# Patient Record
Sex: Female | Born: 1965 | Race: White | Hispanic: No | Marital: Single | Smoking: Never smoker
Health system: Southern US, Community
[De-identification: ages and names within clinical notes are randomized; demographics above are authoritative.]

## PROBLEM LIST (undated history)

## (undated) DIAGNOSIS — F419 Anxiety disorder, unspecified: Secondary | ICD-10-CM

## (undated) DIAGNOSIS — Z8619 Personal history of other infectious and parasitic diseases: Secondary | ICD-10-CM

## (undated) DIAGNOSIS — E079 Disorder of thyroid, unspecified: Secondary | ICD-10-CM

## (undated) DIAGNOSIS — R030 Elevated blood-pressure reading, without diagnosis of hypertension: Secondary | ICD-10-CM

## (undated) HISTORY — PX: TONSILLECTOMY AND ADENOIDECTOMY: SUR1326

## (undated) HISTORY — DX: Elevated blood-pressure reading, without diagnosis of hypertension: R03.0

## (undated) HISTORY — DX: Anxiety disorder, unspecified: F41.9

## (undated) HISTORY — DX: Personal history of other infectious and parasitic diseases: Z86.19

## (undated) HISTORY — DX: Disorder of thyroid, unspecified: E07.9

## (undated) HISTORY — PX: TYMPANOSTOMY TUBE PLACEMENT: SHX32

---

## 2000-11-04 ENCOUNTER — Encounter: Payer: Self-pay | Admitting: Specialist

## 2000-11-04 ENCOUNTER — Ambulatory Visit (HOSPITAL_COMMUNITY): Admission: RE | Admit: 2000-11-04 | Discharge: 2000-11-04 | Payer: Self-pay | Admitting: Specialist

## 2006-01-09 ENCOUNTER — Other Ambulatory Visit: Admission: RE | Admit: 2006-01-09 | Discharge: 2006-01-09 | Payer: Self-pay | Admitting: Obstetrics and Gynecology

## 2012-02-06 ENCOUNTER — Encounter: Payer: Self-pay | Admitting: Internal Medicine

## 2012-02-06 ENCOUNTER — Ambulatory Visit (INDEPENDENT_AMBULATORY_CARE_PROVIDER_SITE_OTHER): Payer: BC Managed Care – PPO | Admitting: Internal Medicine

## 2012-02-06 VITALS — BP 148/80 | HR 72 | Ht 63.5 in | Wt 190.0 lb

## 2012-02-06 DIAGNOSIS — F419 Anxiety disorder, unspecified: Secondary | ICD-10-CM

## 2012-02-06 DIAGNOSIS — R002 Palpitations: Secondary | ICD-10-CM

## 2012-02-06 DIAGNOSIS — Z8249 Family history of ischemic heart disease and other diseases of the circulatory system: Secondary | ICD-10-CM

## 2012-02-06 DIAGNOSIS — F411 Generalized anxiety disorder: Secondary | ICD-10-CM

## 2012-02-06 DIAGNOSIS — E669 Obesity, unspecified: Secondary | ICD-10-CM

## 2012-02-06 DIAGNOSIS — R239 Unspecified skin changes: Secondary | ICD-10-CM

## 2012-02-06 DIAGNOSIS — Z23 Encounter for immunization: Secondary | ICD-10-CM

## 2012-02-06 DIAGNOSIS — E063 Autoimmune thyroiditis: Secondary | ICD-10-CM

## 2012-02-06 DIAGNOSIS — R238 Other skin changes: Secondary | ICD-10-CM

## 2012-02-06 DIAGNOSIS — R030 Elevated blood-pressure reading, without diagnosis of hypertension: Secondary | ICD-10-CM

## 2012-02-06 DIAGNOSIS — R6889 Other general symptoms and signs: Secondary | ICD-10-CM

## 2012-02-06 LAB — CBC WITH DIFFERENTIAL/PLATELET
Basophils Absolute: 0.1 10*3/uL (ref 0.0–0.1)
Basophils Relative: 0.7 % (ref 0.0–3.0)
Eosinophils Absolute: 0.1 10*3/uL (ref 0.0–0.7)
Eosinophils Relative: 1.4 % (ref 0.0–5.0)
HCT: 44.6 % (ref 36.0–46.0)
Hemoglobin: 15.3 g/dL — ABNORMAL HIGH (ref 12.0–15.0)
Lymphocytes Relative: 26.9 % (ref 12.0–46.0)
Lymphs Abs: 2.2 10*3/uL (ref 0.7–4.0)
MCHC: 34.4 g/dL (ref 30.0–36.0)
MCV: 94.3 fl (ref 78.0–100.0)
Monocytes Absolute: 0.7 10*3/uL (ref 0.1–1.0)
Monocytes Relative: 8 % (ref 3.0–12.0)
Neutro Abs: 5.3 10*3/uL (ref 1.4–7.7)
Neutrophils Relative %: 63 % (ref 43.0–77.0)
Platelets: 293 10*3/uL (ref 150.0–400.0)
RBC: 4.73 Mil/uL (ref 3.87–5.11)
RDW: 13.3 % (ref 11.5–14.6)
WBC: 8.3 10*3/uL (ref 4.5–10.5)

## 2012-02-06 LAB — BASIC METABOLIC PANEL
BUN: 10 mg/dL (ref 6–23)
CO2: 27 mEq/L (ref 19–32)
Calcium: 9.2 mg/dL (ref 8.4–10.5)
Chloride: 106 mEq/L (ref 96–112)
Creatinine, Ser: 0.8 mg/dL (ref 0.4–1.2)
GFR: 85.25 mL/min (ref >60.00)
Glucose, Bld: 89 mg/dL (ref 70–99)
Potassium: 4.2 mEq/L (ref 3.5–5.1)
Sodium: 139 mEq/L (ref 135–145)

## 2012-02-06 LAB — FERRITIN: Ferritin: 28.9 ng/mL (ref 10.0–291.0)

## 2012-02-06 LAB — LIPID PANEL
Total CHOL/HDL Ratio: 3
VLDL: 15.8 mg/dL (ref 0.0–40.0)

## 2012-02-06 LAB — HEPATIC FUNCTION PANEL
AST: 19 U/L (ref 0–37)
Alkaline Phosphatase: 49 U/L (ref 39–117)
Total Bilirubin: 0.6 mg/dL (ref 0.3–1.2)

## 2012-02-06 LAB — POCT URINALYSIS DIPSTICK
Nitrite, UA: NEGATIVE
Urobilinogen, UA: 0.2
pH, UA: 8.5

## 2012-02-06 LAB — T4, FREE: Free T4: 0.66 ng/dL (ref 0.60–1.60)

## 2012-02-06 LAB — VITAMIN B12: Vitamin B-12: 546 pg/mL (ref 211–911)

## 2012-02-06 LAB — T3, FREE: T3, Free: 3.3 pg/mL (ref 2.3–4.2)

## 2012-02-06 NOTE — Patient Instructions (Addendum)
Will notify you  of labs when available. Stop the heating pad for now. Will check skin at next visit  Plan  Check up and pp and pelvic in about  A month.  Some of your sx could be from thyroid dysfucntion. Stop  Supplements as we dicussed and get nutrients in food but need  Reg MV wth vit d .

## 2012-02-06 NOTE — Progress Notes (Signed)
Subjective:    Patient ID: Christina Mccormick, female    DOB: May 11, 1966, 46 y.o.   MRN: [id number]  HPI Patient comes in as new patient visit . Previous care was remote   No PCP in the past numbner o years and is over due for  Last pap ?4-5 years ago.  Anxiety since  Childhood.  Prn    Anxiety  Usually premenstrual  Use ocass panic and obsessing. Dr Evelene Croon has given her prn xanax  Which does help. Only using about 5 days er month.  No sig etoh with hx of etoh issues in family. Functions pretty well but anxiety with doctor  visits Last Paps  About one decade.  No recent immunization.  THyroid: dx as hashimotos as a teen and at some point was on meds  Unsure why stopped . has been issue off and on.Considering going back on meds .   Able to exercise  [place] dance.  Losing weight 15 pounds  On [employer]   Exercising  3-4 x per week.  Has cold intolerance  Used heating pad on back when sits to warm up  In the evening  Palpitations began  About 6-9 months ago and  Usually related to period .  Last minutes and not assoc with syncope sob or extreme fatigue some worse with caffiene.  ocass etoh.  Caffiene.  Every day usually in am.   Some diet soda ocass.   Vegetarian  With fish .  Trying decrease sugar ; says she is a "Sugar fanatic   :.  ELEVated BP readings  In past but ? Felt to be from anxiety   Unsure if taking readings  Cause elevation Review of Systems  ROS:  GEN/ HEENT: No fever, significant weight changes sweats headaches vision problems hearing changes, CV/ PULM; No chest pain shortness of breath cough, syncope,edema  change in exercise tolerance. GI /GU: No adominal pain, vomiting, change in bowel habits. No blood in the stool. No significant GU symptoms. Periods ok  No changes some spotting  At times halting bleeding SKIN/HEME: ,no acute skin rashes suspicious lesions or bleeding. No lymphadenopathy, nodules, masses.  NEURO/ PSYCH:  No neurologic signs such as weakness numbness. No  depression IMM/ Allergy: No unusual infections.  Allergy .   REST of 12 system review negative except as per HPI  Both parents have ht and lipids   Past Medical History  Diagnosis Date  . Anxiety   . Elevated blood pressure reading   . Thyroid disease     unsure if sustained never treated  . Hx of varicella     History   Social History  . Marital Status: Single    Spouse Name: N/A    Number of Children: N/A  . Years of Education: N/A   Occupational History  . Not on file.   Social History Main Topics  . Smoking status: Never Smoker   . Smokeless tobacco: Not on file  . Alcohol Use: Yes     2-3 a month  . Drug Use: No  . Sexually Active: Not on file   Other Topics Concern  . 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  Has smoke detector and wears seat belts.  No firearms. No excess sun exposure. . No depression. Partial dentures uppercaffine in am  etoh 2-3  monthVegetarian diet recently     Past Surgical History  Procedure Date  . Tonsillectomy and adenoidectomy   . Tympanostomy tube placement     Family History  Problem Relation Age of Onset  . Hyperlipidemia Mother   . Hypertension Mother   . Hyperlipidemia Father   . Hypertension Father   . Celiac disease Father   . Alcohol abuse Maternal Grandmother   . Heart disease Maternal Grandfather   . Alcohol abuse Paternal Grandmother   . Alcohol abuse Paternal Grandfather   . Melanoma      uncle     Allergies  Allergen Reactions  . Sulfa Antibiotics Rash    No current outpatient prescriptions on file prior to visit.    BP 148/80  Pulse 72  Ht 5' 3.5" (1.613 m)  Wt 190 lb (86.183 kg)  BMI 33.13 kg/m2  LMP 01/19/2012       Objective:   Physical Exam Physical Exam: Vital signs reviewed [id number] is a well-developed well-nourished alert cooperative  white  female who appears her stated age in no acute distress. Talks fast anxious but good eye contact  .  HEENT: normocephalic atraumatic , Eyes: PERRL EOM's full, conjunctiva clear, Nares: paten,t no deformity discharge or tenderness., Ears: no deformity EAC's clear TMs with normal landmarks. Mouth: clear OP, no lesions, edema.  Moist mucous membranes. Dentition in adequate repair. Upper partials  NECK: supple without masses,  or bruits. Smooth goiter 2-3 x non tender and no nodules felt CHEST/PULM:  Clear to auscultation and percussion breath sounds equal no wheeze , rales or rhonchi. No chest wall deformities or tenderness. CV: PMI is nondisplaced, S1 S2 no gallops, murmurs, rubs. Peripheral pulses are full without delay.No JVD .  ABDOMEN: Bowel sounds normal nontender  No guard or rebound, no hepato splenomegal no CVA tenderness.  Extremtities:  No clubbing cyanosis or edema, no acute joint swelling or redness no focal atrophy NEURO:  Oriented x3, cranial nerves 3-12 appear to be intact, no obvious focal weakness,gait within normal limits no abnormal reflexes or asymmetrical SKIN: No acute rashes normal turgor, color, no bruising or petechiae.EXCEPT mottling  On lower lumbar area with irreg areas of re pink  Linear  Non tender and not itching  PSYCH: Oriented, good eye contact, no obvious depression , cognition and judgment appear normal. LN: no cervical adenopathy    EKG  NSR nl intervals     Assessment & Plan:  Preventive Health Care Delayed    Need to review and get UTD   But address other issues today dsic supplement use and dec pill count unless for a reason such as ca vit d keep exercising and lose weight Flu shot.  Advise d come back for pap pelvic  Get mammo  Anxiety apparently functions well with this  On prn meds   Aware of risk benefit  Would continue to have Dr Kirtland Bouchard rx meds  Thyroid disease hx  Has goiter follow labs for now BP readings  Elevated  Has fam hx but also anxiety   Disc plus  minus of home monitoring  when plans on getting machine to try and bring in to next visit with readings Cold  Intolerance per pt     Plan labs and fu Heat  Affected skin area  ? Fat necrosis and healing burn?   Will follow  Stop the heating pad  Family hx  Palpitations  Nl exam  Many causes possible  Nl   ekg  Get lab  Minimize caffiene and fu. Sx better with exercise to continue.     Prolonged visit today 50 minutes

## 2012-02-07 ENCOUNTER — Encounter: Payer: Self-pay | Admitting: Internal Medicine

## 2012-02-07 DIAGNOSIS — E669 Obesity, unspecified: Secondary | ICD-10-CM | POA: Insufficient documentation

## 2012-02-07 DIAGNOSIS — F419 Anxiety disorder, unspecified: Secondary | ICD-10-CM | POA: Insufficient documentation

## 2012-02-07 DIAGNOSIS — R239 Unspecified skin changes: Secondary | ICD-10-CM | POA: Insufficient documentation

## 2012-02-07 DIAGNOSIS — E063 Autoimmune thyroiditis: Secondary | ICD-10-CM | POA: Insufficient documentation

## 2012-02-07 DIAGNOSIS — R002 Palpitations: Secondary | ICD-10-CM | POA: Insufficient documentation

## 2012-02-07 DIAGNOSIS — R6889 Other general symptoms and signs: Secondary | ICD-10-CM | POA: Insufficient documentation

## 2012-02-12 ENCOUNTER — Other Ambulatory Visit: Payer: Self-pay | Admitting: Internal Medicine

## 2012-02-12 MED ORDER — LEVOTHYROXINE SODIUM 50 MCG PO TABS: 50.0000 ug | ORAL_TABLET | Freq: Every day | ORAL | Status: DC

## 2012-02-12 NOTE — Progress Notes (Signed)
Quick Note:  Pt aware of results. ______ 

## 2012-03-05 ENCOUNTER — Encounter: Payer: Self-pay | Admitting: Internal Medicine

## 2012-03-05 ENCOUNTER — Ambulatory Visit (INDEPENDENT_AMBULATORY_CARE_PROVIDER_SITE_OTHER): Payer: BC Managed Care – PPO | Admitting: Internal Medicine

## 2012-03-05 ENCOUNTER — Other Ambulatory Visit (HOSPITAL_COMMUNITY)
Admission: RE | Admit: 2012-03-05 | Discharge: 2012-03-05 | Disposition: A | Discharge status: Home / Self Care | Payer: BC Managed Care – PPO | Source: Ambulatory Visit | Attending: Internal Medicine | Admitting: Internal Medicine

## 2012-03-05 VITALS — BP 130/80 | HR 88 | Wt 190.0 lb

## 2012-03-05 DIAGNOSIS — F411 Generalized anxiety disorder: Secondary | ICD-10-CM

## 2012-03-05 DIAGNOSIS — R002 Palpitations: Secondary | ICD-10-CM

## 2012-03-05 DIAGNOSIS — Z0000 Encounter for general adult medical examination without abnormal findings: Secondary | ICD-10-CM

## 2012-03-05 DIAGNOSIS — E063 Autoimmune thyroiditis: Secondary | ICD-10-CM

## 2012-03-05 DIAGNOSIS — Z01419 Encounter for gynecological examination (general) (routine) without abnormal findings: Secondary | ICD-10-CM | POA: Insufficient documentation

## 2012-03-05 DIAGNOSIS — F419 Anxiety disorder, unspecified: Secondary | ICD-10-CM

## 2012-03-05 DIAGNOSIS — E669 Obesity, unspecified: Secondary | ICD-10-CM

## 2012-03-05 DIAGNOSIS — N852 Hypertrophy of uterus: Secondary | ICD-10-CM

## 2012-03-05 NOTE — Progress Notes (Signed)
Subjective:    Patient ID: Christina Mccormick, female    DOB: 1966/10/14, 46 y.o.   MRN: [id number]  HPI Patient comes in today for preventive visit and follow-up of medical issues. Update  history since  last visit: doing better . Some decrease in anxiety and possibly blood pressure. Review her symptoms and she has been on the Synthroid. His aware of the laboratory studies we did at last visit Not had a Pap and pelvic exam but twice in her life.  Not high risk neg sa   Review of Systems Neg cp sob  Skin better on back  Less could no bleeding  Periods halting but normally monthly  No excessive cramps. No uti sx.   Past history family history social history reviewed in the electronic medical record.     Objective:   Physical Exam  Wt Readings from Last 3 Encounters:  03/05/12 190 lb (86.183 kg)  02/06/12 190 lb (86.183 kg)    Physical Exam: Vital signs reviewed NWG:[id number] is a well-developed well-nourished alert cooperative  white female who appears her stated age in no acute distress. mildy anxious  HEENT: normocephalic atraumatic , Eyes: PERRL EOM's full, conjunctiva clear, Nares: paten,t no deformity discharge or tenderness., Ears: no deformity EAC's clear TMs with normal landmarks. Mouth: clear OP, no lesions, edema.  Moist mucous membranes. Dentition in adequate repair. NECK: supple without masses, or bruits.  thyroid CHEST/PULM:  Clear to auscultation and percussion breath sounds equal no wheeze , rales or rhonchi. No chest wall deformities or tenderness. CV: PMI is nondisplaced, S1 S2 no gallops, murmurs, rubs. Peripheral pulses are full without delay.No JVD .  ABDOMEN: Bowel sounds normal nontender  No guard or rebound, no hepato splenomegal no CVA tenderness.  No hernia. Extremtities:  No clubbing cyanosis or edema, no acute joint swelling or redness no focal atrophy NEURO:  Oriented x3, cranial nerves 3-12 appear to be intact, no obvious focal weakness,gait within normal limits no  abnormal reflexes or asymmetrical SKIN: No acute rashes normal turgor, color, no bruising or petechiae. PSYCH: Oriented, good eye contact, no obvious depression , cognition and judgment appear normal. Anxiety but better than last time. LN: no cervical axillary inguinal adenopathy   Breast: normal by inspection . No dimpling, discharge, masses, tenderness or discharge . Pelvic: NL ext GU, labia clear without lesions or rash . Vagina no lesions .Cervix: clear  UTERUS: Neg CMT   Enlarged on right   To almost iliac crest Adnexa:  ? Hard to tell on right cause of enlarge uterus left  no masses . PAP done  Rectal no masses stool heme neg   Lab Results  Component Value Date   WBC 8.3 02/06/2012   HGB 15.3* 02/06/2012   HCT 44.6 02/06/2012   PLT 293.0 02/06/2012   GLUCOSE 89 02/06/2012   CHOL 211* 02/06/2012   TRIG 79.0 02/06/2012   HDL 66.30 02/06/2012   LDLDIRECT 131.0 02/06/2012   ALT 13 02/06/2012   AST 19 02/06/2012   NA 139 02/06/2012   K 4.2 02/06/2012   CL 106 02/06/2012   CREATININE 0.8 02/06/2012   BUN 10 02/06/2012   CO2 27 02/06/2012   TSH 4.57 02/06/2012         Assessment & Plan:  Preventive Health Care Counseled regarding healthy nutrition, exercise, sleep, injury prevention, calcium vit d and healthy weight .  Hashimotos   On low dose repalcement and feels some beter already. No se of meds  Check tsh  in 2 months and ov.   Bp  Better   OBesity  anxiety  Enlarged uterus   Presumed  Fibroids.

## 2012-03-05 NOTE — Patient Instructions (Addendum)
Your exam is normal except that you have an enlarged uterus. This is probably from fibroids.  Would recommend ultrasound test of your uterus to confirm the diagnosis.  If this is the case then  no treatment intervention recommended unless she develops severe bleeding or other problems. We could get  a gynecologist to see you at that point.  Otherwise take the thyroid replacment and then  Check labs in 2 more months ( tsh)  Fibroids You have been diagnosed as having a fibroid. Fibroids are smooth muscle lumps (tumors) which can occur any place in a woman's body. They are usually in the womb (uterus). The most common problem (symptom) of fibroids is bleeding. Over time this may cause low red blood cells (anemia). Other symptoms include feelings of pressure and pain in the pelvis. The diagnosis (learning what is wrong) of fibroids is made by physical exam. Sometimes tests such as an ultrasound are used. This is helpful when fibroids are felt around the ovaries and to look for tumors. TREATMENT   Most fibroids do not need surgical or medical treatment. Sometimes a tissue sample (biopsy) of the lining of the uterus is done to rule out cancer. If there is no cancer and only a small amount of bleeding, the problem can be watched.   Hormonal treatment can improve the problem.   When surgery is needed, it can consist of removing the fibroid. Vaginal birth may not be possible after the removal of fibroids. This depends on where they are and the extent of surgery. When pregnancy occurs with fibroids it is usually normal.   Your caregiver can help decide which treatments are best for you.  HOME CARE INSTRUCTIONS   Do not use aspirin as this may increase bleeding problems.   If your periods (menses) are heavy, record the number of pads or tampons used per month. Bring this information to your caregiver. This can help them determine the best treatment for you.  SEEK IMMEDIATE MEDICAL CARE IF:  You have  pelvic pain or cramps not controlled with medications, or experience a sudden increase in pain.   You have an increase of pelvic bleeding between and during menses.   You feel lightheaded or have fainting spells.   You develop worsening belly (abdominal) pain.  Document Released: 12/12/2000 Document Revised: 12/04/2011 Document Reviewed: 08/03/2008 [facility] Patient Information 2012 [place], [place].    Transvaginal Ultrasound Transvaginal ultrasound is a pelvic ultrasound, using a metal probe that is placed in the vagina, to look at a women's female organs. Transvaginal ultrasound is a method of seeing inside the pelvis of a woman. The ultrasound machine sends out sound waves from the transducer (probe). These sound waves bounce off body structures (like an echo) to create a picture. The picture shows up on a monitor. It is called transvaginal because the probe is inserted into the vagina. There should be very little discomfort from the vaginal probe. This test can also be used during pregnancy. Endovaginal ultrasound is another name for a transvaginal ultrasound. In a transabdominal ultrasound, the probe is placed on the outside of the belly. This method gives pictures that are lower quality than pictures from the transvaginal technique. Transvaginal ultrasound is used to look for problems of the female genital tract. Some such problems include:  Infertility problems.   Congenital (birth defect) malformations of the uterus and ovaries.   Tumors in the uterus.   Abnormal bleeding.   Ovarian tumors and cysts.   Abscess (inflamed tissue around  pus) in the pelvis.   Unexplained abdominal or pelvic pain.   Pelvic infection.  DURING PREGNANCY, TRANSVAGINAL ULTRASOUND MAY BE USED TO LOOK AT:  Normal pregnancy.   Ectopic pregnancy (pregnancy outside the uterus).   Fetal heartbeat.   Abnormalities in the pelvis, that are not seen well with transabdominal ultrasound.   Suspected twins  or multiples.   Impending miscarriage.   Problems with the cervix (incompetent cervix, not able to stay closed and hold the baby).   When doing an amniocentesis (removing fluid from the pregnancy sac, for testing).   Looking for abnormalities of the baby.   Checking the growth, development, and age of the fetus.   Measuring the amount of fluid in the amniotic sac.   When doing an external version of the baby (moving baby into correct position).   Evaluating the baby for problems in high risk pregnancies (biophysical profile).   Suspected fetal demise (death).  Sometimes a special ultrasound method called Saline Infusion Sonography (SIS) is used for a more accurate look at the uterus. Sterile saline (salt water) is injected into the uterus of non-pregnant patients to see the inside of the uterus better. SIS is not used on pregnant women. The vaginal probe can also assist in obtaining biopsies of abnormal areas, in draining fluid from cysts on the ovary, and in finding IUDs (intrauterine device, birth control) that cannot be located. PREPARATION FOR TEST A transvaginal ultrasound is done with the bladder empty. The transabdominal ultrasound is done with your bladder full. You may be asked to drink several glasses of water before that exam. Sometimes, a transabdominal ultrasound is done just after a transvaginal ultrasound, to look at organs in your abdomen. PROCEDURE  You will lie down on a table, with your knees bent and your feet in foot holders. The probe is covered with a condom. A sterile lubricant is put into the vagina and on the probe. The lubricant helps transmit the sound waves and avoid irritating the vagina. Your caregiver will move the probe inside the vaginal cavity to scan the pelvic structures. A normal test will show a normal pelvis and normal contents. An abnormal test will show abnormalities of the pelvis, placenta, or baby. ABNORMAL RESULTS MAY BE DUE TO:  Growths or  tumors in the:   Uterus.   Ovaries.   Vagina.   Other pelvic structures.   Non-cancerous growths of the uterus and ovaries.   Twisting of the ovary, cutting off blood supply to the ovary (ovarian torsion).   Areas of infection, including:   Pelvic inflammatory disease.   Abscess in the pelvis.   Locating an IUD.  PROBLEMS FOUND IN PREGNANT WOMEN MAY INCLUDE:  Ectopic pregnancy (pregnancy outside the uterus).   Multiple pregnancies.   Early dilation (opening) of the cervix. This may indicate an incompetent cervix and early delivery.   Impending miscarriage.   Fetal death.   Problems with the placenta, including:   Placenta has grown over the opening of the womb (placenta previa).   Placenta has separated early in the womb (placental abruption).   Placenta grows into the muscle of the uterus (placenta accreta).   Tumors of pregnancy, including gestational trophoblastic disease. This is an abnormal pregnancy, with no fetus. The uterus is filled with many grape-like cysts that could sometimes be cancerous.   Incorrect position of the fetus (breech, vertex).   Intrauterine fetal growth retardation (IUGR) (poor growth in the womb).   Fetal abnormalities or infection.  RISKS  AND COMPLICATIONS There are no known risks to the ultrasound procedure. There is no X-ray used when doing an ultrasound. Document Released: 11/26/2004 Document Revised: 12/04/2011 Document Reviewed: 11/14/2009 [facility] Patient Information 2012 [place], [place].

## 2012-03-08 ENCOUNTER — Other Ambulatory Visit: Payer: Self-pay | Admitting: Internal Medicine

## 2012-03-08 DIAGNOSIS — N852 Hypertrophy of uterus: Secondary | ICD-10-CM

## 2012-03-10 NOTE — Progress Notes (Signed)
Quick Note:  Tell patient PAP is normal. ______ 

## 2012-03-11 ENCOUNTER — Encounter: Payer: Self-pay | Admitting: *Deleted

## 2012-03-11 NOTE — Progress Notes (Signed)
Quick Note:    Letter sent to pt.  ______

## 2012-04-05 ENCOUNTER — Other Ambulatory Visit: Payer: Self-pay | Admitting: Internal Medicine

## 2012-04-23 ENCOUNTER — Ambulatory Visit
Admission: RE | Admit: 2012-04-23 | Discharge: 2012-04-23 | Disposition: A | Discharge status: Home / Self Care | Payer: BC Managed Care – PPO | Source: Ambulatory Visit | Attending: Internal Medicine | Admitting: Internal Medicine

## 2012-04-23 DIAGNOSIS — N852 Hypertrophy of uterus: Secondary | ICD-10-CM

## 2012-04-27 NOTE — Progress Notes (Signed)
Pt informed- instructed to call back if starts having painful m enstration

## 2012-05-07 ENCOUNTER — Ambulatory Visit (INDEPENDENT_AMBULATORY_CARE_PROVIDER_SITE_OTHER): Payer: BC Managed Care – PPO | Admitting: Internal Medicine

## 2012-05-07 ENCOUNTER — Encounter: Payer: Self-pay | Admitting: Internal Medicine

## 2012-05-07 VITALS — BP 122/90 | HR 105 | Temp 98.4°F | Wt 191.0 lb

## 2012-05-07 DIAGNOSIS — D259 Leiomyoma of uterus, unspecified: Secondary | ICD-10-CM | POA: Insufficient documentation

## 2012-05-07 DIAGNOSIS — E063 Autoimmune thyroiditis: Secondary | ICD-10-CM

## 2012-05-07 LAB — TSH: TSH: 2.67 u[IU]/mL (ref 0.35–5.50)

## 2012-05-07 NOTE — Progress Notes (Signed)
  Subjective:    Patient ID: JOEE IOVINE, female    DOB: [birth year], 46 y.o.   MRN: [id number]  HPI Pt comes in  For labs and fu [place] . Periods ok but some pain . Had [place]  Feels much better less anxiety  On thyroid replacement. Less cold on meds.  Anxiety is better now   bp seems ok  hasnt checked   Review of Systems No new sx.  No cp sob vision change bleeding  Past history family history social history reviewed in the electronic medical record.    Objective:   Physical Exam BP 122/90  Pulse 105  Temp(Src) 98.4 F (36.9 C) (Oral)  Wt 191 lb (86.637 kg)  SpO2 98%  LMP 05/06/2012 wdwn in nad Repeat bp right 144/88   See  [place] multiple fibroids no other abnormalities reviewed with pt   Lab Results  Component Value Date   TSH 4.57 02/06/2012      Assessment & Plan:  Thyroid itis   Disc about this Reviewed   Process   CPX in a year but can adjust meds over the phone with labs etc.  Fibroids.    Enlarged uterus  nosx at this point   so will  Just abserve  bp  Check at home    Continue lifestyle intervention healthy eating and exercise .   Lab pending  cpx in a year of earlier if needed   Cost concerns with high deductible.

## 2012-05-07 NOTE — Patient Instructions (Signed)
Get the tsh today and then will notify you  Get bp readings at convenience .  Otherwise can do yearly check .

## 2012-05-12 ENCOUNTER — Other Ambulatory Visit: Payer: Self-pay | Admitting: Internal Medicine

## 2012-05-12 DIAGNOSIS — Z8639 Personal history of other endocrine, nutritional and metabolic disease: Secondary | ICD-10-CM

## 2012-05-12 NOTE — Progress Notes (Signed)
Quick Note:  Spoke with pt- informed of lab resutls and dr. Rosezella [place] instructions - repeat lab in 6 mos - future order done ______

## 2012-06-02 ENCOUNTER — Other Ambulatory Visit: Payer: Self-pay | Admitting: Internal Medicine

## 2012-08-21 IMAGING — US US PELVIS COMPLETE
1 series · 13 of 25 positions shown · non-contrast
Comparison: None.

CLINICAL DATA: Possible uterine fibroid felt on physical
examination.  Severe abdominal and pelvic pain during menstrual
periods.

TRANSABDOMINAL AND TRANSVAGINAL ULTRASOUND OF PELVIS
TECHNIQUE: Both transabdominal and transvaginal ultrasound
examinations of the pelvis were performed. Transabdominal technique
was performed for global imaging of the pelvis including uterus,
ovaries, adnexal regions, and pelvic cul-de-sac.

[Series 1: us pelvis complete · 0.35mm/px · 13 of 69 slices shown]
[im 1/69]
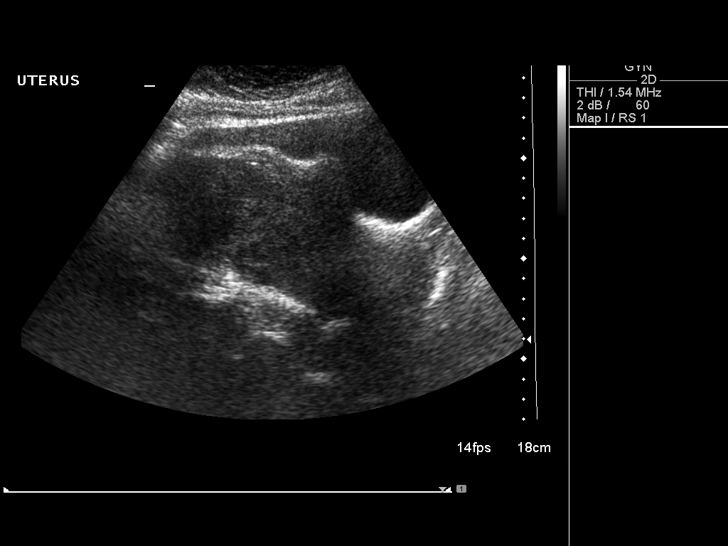
[im 6/69]
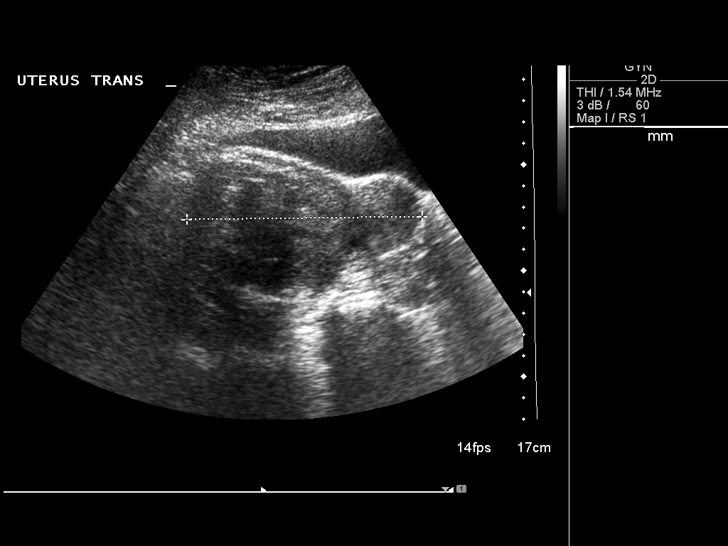
[im 12/69]
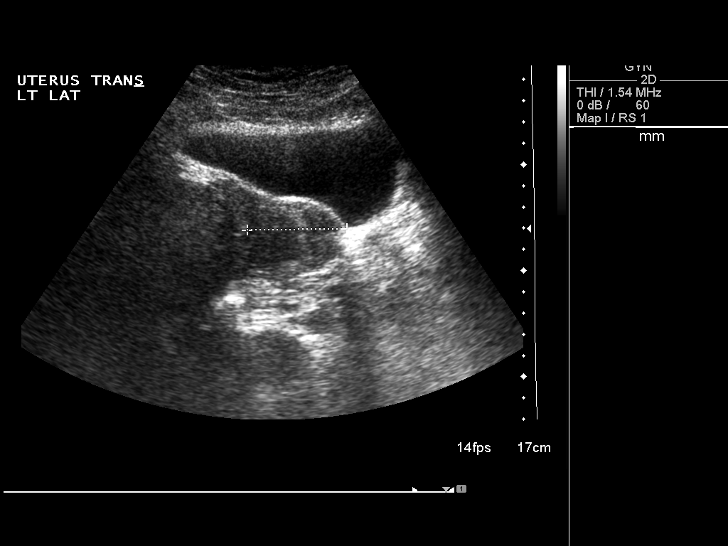
[im 18/69]
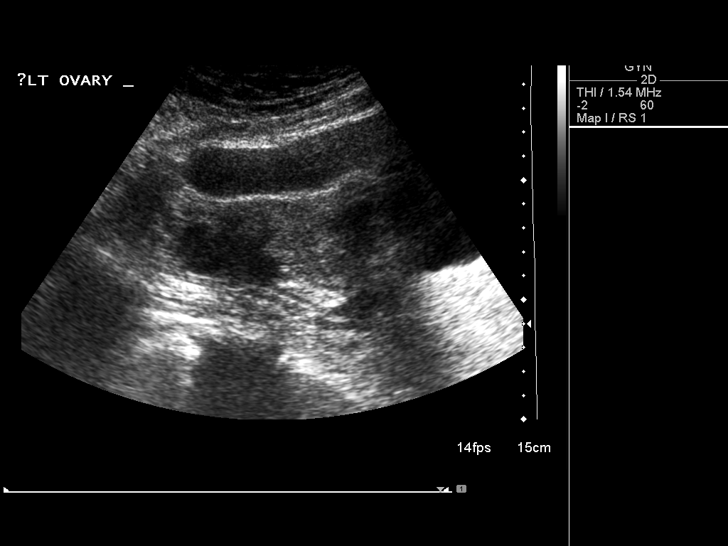
[im 23/69]
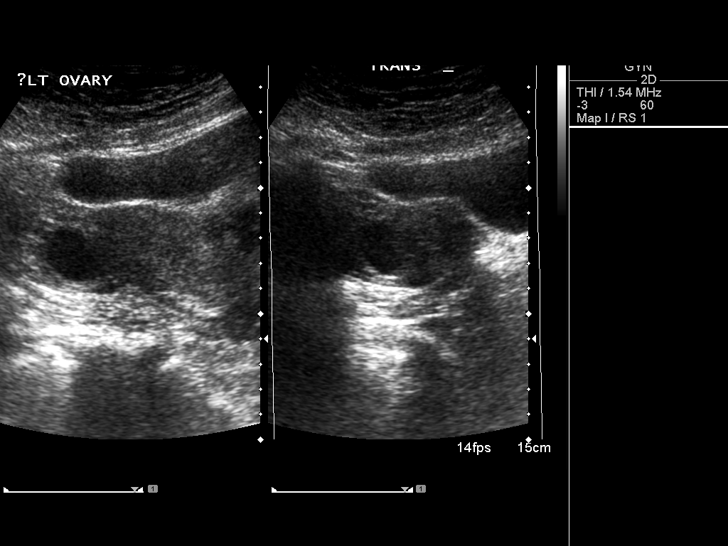
[im 29/69]
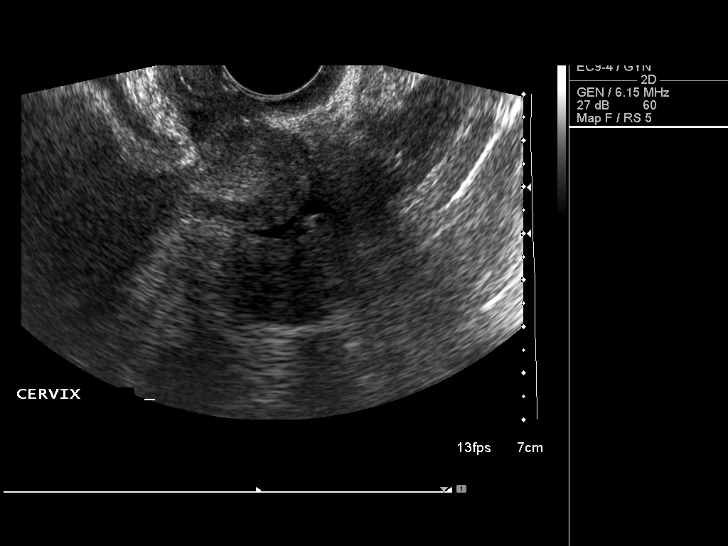
[im 35/69]
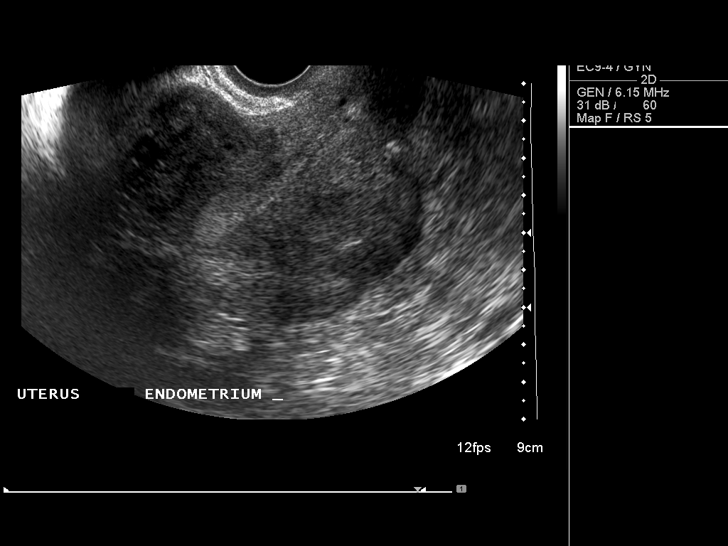
[im 40/69]
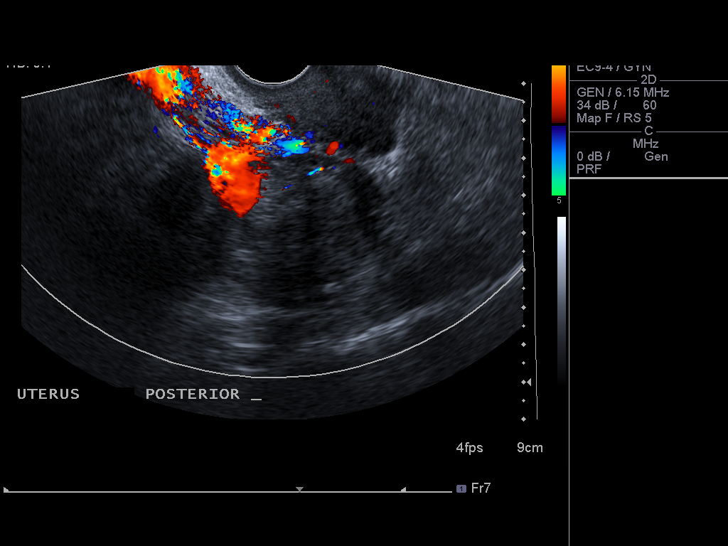
[im 46/69]
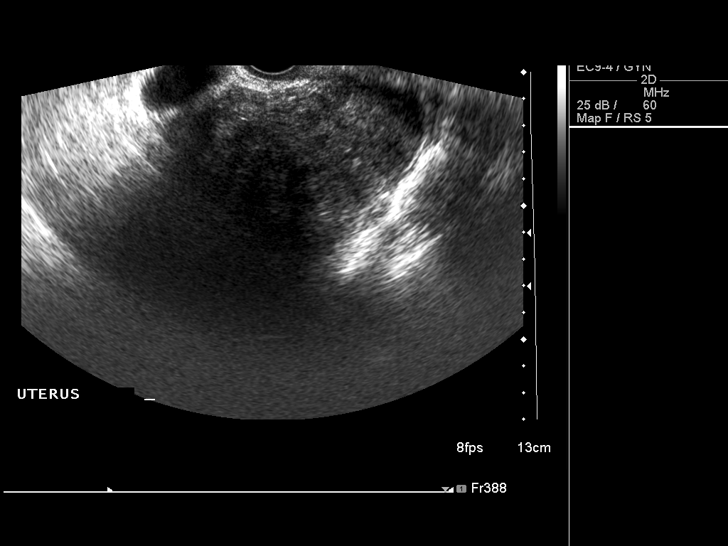
[im 52/69]
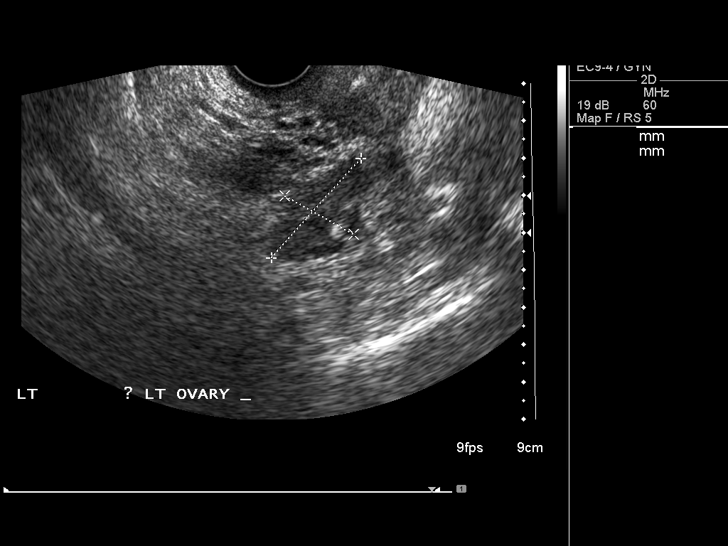
[im 57/69]
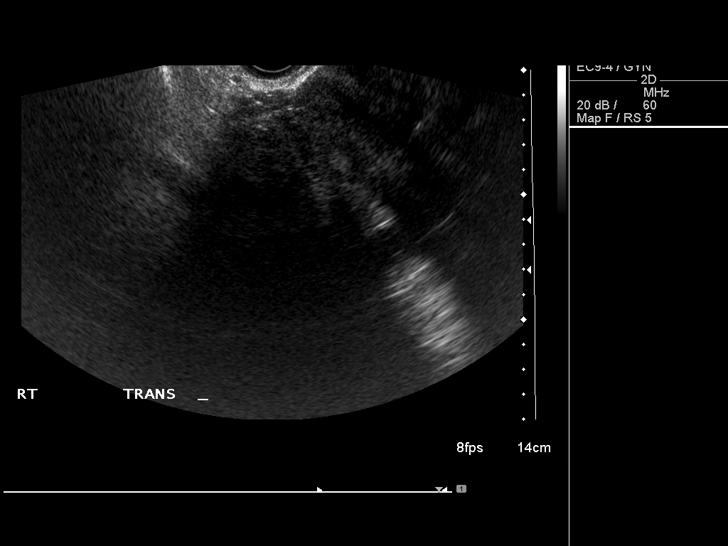
[im 63/69]
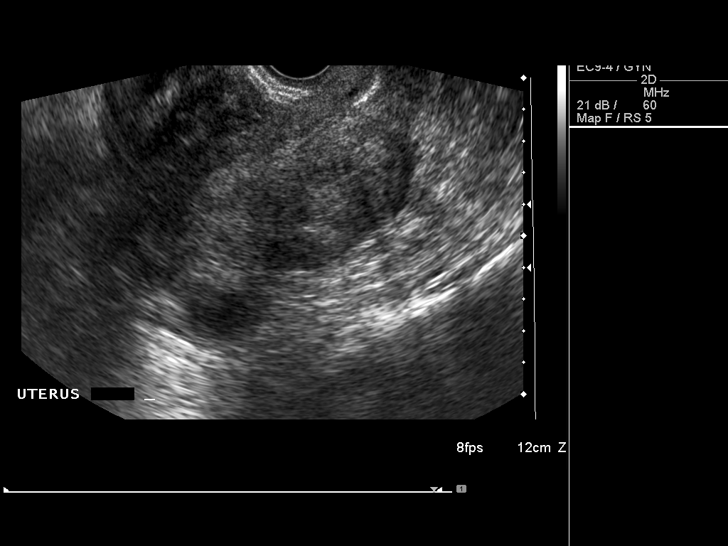
[im 69/69]
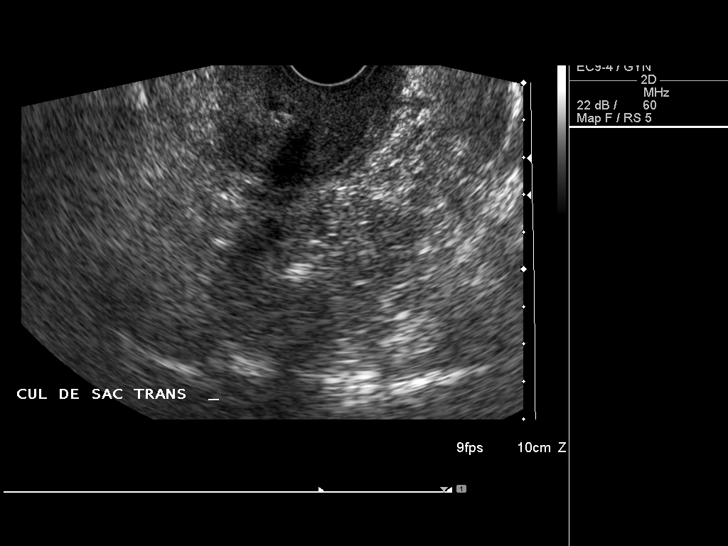

[13 of 25 positions shown; findings below may reference images not displayed]

It was necessary to proceed with endovaginal exam following the
transabdominal exam to visualize the uterus and ovaries in better
detail..
FINDINGS: Uterus: Normally positioned.  Large mass extending from the uterine
fundus into the lower quadrant of the abdomen on the right.  The
margins of the mass are difficult to visualize, making measurement
difficult.  There is a second mass arising exophytically from the
anterior uterine fundus, measuring 3.9 x 3.5 x 3.3 cm.  A third
mass is noted posteriorly on the right, measuring 4.8 x 4.1 x
cm.  None of these masses appeared to have submucosal components.
The uterus measures approximately 14.6 x 11.1 x 6.9 cm in maximum
dimensions.

Endometrium: Normal in thickness and appearance

Right ovary:  Normal, containing a 2.38 cm follicle.

Left ovary: Normal.

Other findings: No free fluid
IMPRESSION: Multiple uterine fibroids, as described above.

## 2012-11-08 ENCOUNTER — Other Ambulatory Visit: Payer: Self-pay | Admitting: Internal Medicine

## 2012-11-09 ENCOUNTER — Other Ambulatory Visit: Payer: Self-pay | Admitting: Internal Medicine

## 2013-02-11 ENCOUNTER — Encounter: Payer: BC Managed Care – PPO | Admitting: Internal Medicine

## 2013-03-19 ENCOUNTER — Other Ambulatory Visit: Payer: Self-pay | Admitting: Internal Medicine

## 2013-06-17 ENCOUNTER — Ambulatory Visit (INDEPENDENT_AMBULATORY_CARE_PROVIDER_SITE_OTHER): Payer: No Typology Code available for payment source | Admitting: Internal Medicine

## 2013-06-17 ENCOUNTER — Encounter: Payer: Self-pay | Admitting: Internal Medicine

## 2013-06-17 VITALS — BP 130/80 | HR 115 | Temp 98.5°F | Ht 64.0 in | Wt 194.0 lb

## 2013-06-17 DIAGNOSIS — Z23 Encounter for immunization: Secondary | ICD-10-CM

## 2013-06-17 DIAGNOSIS — Z0000 Encounter for general adult medical examination without abnormal findings: Secondary | ICD-10-CM

## 2013-06-17 DIAGNOSIS — E063 Autoimmune thyroiditis: Secondary | ICD-10-CM

## 2013-06-17 LAB — HEPATIC FUNCTION PANEL
Alkaline Phosphatase: 59 U/L (ref 39–117)
Bilirubin, Direct: 0.1 mg/dL (ref 0.0–0.3)
Total Protein: 7.8 g/dL (ref 6.0–8.3)

## 2013-06-17 LAB — BASIC METABOLIC PANEL
Calcium: 9.4 mg/dL (ref 8.4–10.5)
GFR: 85.99 mL/min (ref >60.00)
Glucose, Bld: 106 mg/dL — ABNORMAL HIGH (ref 70–99)
Sodium: 138 mEq/L (ref 135–145)

## 2013-06-17 LAB — LIPID PANEL
HDL: 60.3 mg/dL (ref >39.00)
VLDL: 20.6 mg/dL (ref 0.0–40.0)

## 2013-06-17 LAB — CBC WITH DIFFERENTIAL/PLATELET
Basophils Absolute: 0.1 10*3/uL (ref 0.0–0.1)
Hemoglobin: 14.9 g/dL (ref 12.0–15.0)
Lymphocytes Relative: 22.1 % (ref 12.0–46.0)
Monocytes Relative: 7.3 % (ref 3.0–12.0)
Neutro Abs: 5.7 10*3/uL (ref 1.4–7.7)
RDW: 12.8 % (ref 11.5–14.6)
WBC: 8.3 10*3/uL (ref 4.5–10.5)

## 2013-06-17 LAB — TSH: TSH: 5.35 u[IU]/mL (ref 0.35–5.50)

## 2013-06-17 LAB — LDL CHOLESTEROL, DIRECT: Direct LDL: 154.4 mg/dL

## 2013-06-17 LAB — T4, FREE: Free T4: 0.75 ng/dL (ref 0.60–1.60)

## 2013-06-17 NOTE — Patient Instructions (Addendum)
Continue lifestyle intervention healthy eating and exercise .  Hep a today   Second in 6 months  Check on [email or web address]  For travel advice. Will notify you  of labs when available.  Preventive in a year  Preventive Care for Adults, Female A healthy lifestyle and preventive care can promote health and wellness. Preventive health guidelines for women include the following key practices.  A routine yearly physical is a good way to check with your caregiver about your health and preventive screening. It is a chance to share any concerns and updates on your health, and to receive a thorough exam.  Visit your dentist for a routine exam and preventive care every 6 months. Brush your teeth twice a day and floss once a day. Good oral hygiene prevents tooth decay and gum disease.  The frequency of eye exams is based on your age, health, family medical history, use of contact lenses, and other factors. Follow your caregiver's recommendations for frequency of eye exams.  Eat a healthy diet. Foods like vegetables, fruits, whole grains, low-fat dairy products, and lean protein foods contain the nutrients you need without too many calories. Decrease your intake of foods high in solid fats, added sugars, and salt. Eat the right amount of calories for you.Get information about a proper diet from your caregiver, if necessary.  Regular physical exercise is one of the most important things you can do for your health. Most adults should get at least 150 minutes of moderate-intensity exercise (any activity that increases your heart rate and causes you to sweat) each week. In addition, most adults need muscle-strengthening exercises on 2 or more days a week.  Maintain a healthy weight. The body mass index (BMI) is a screening tool to identify possible weight problems. It provides an estimate of body fat based on height and weight. Your caregiver can help determine your BMI, and can help you achieve or maintain a healthy  weight.For adults 20 years and older:  A BMI below 18.5 is considered underweight.  A BMI of 18.5 to 24.9 is normal.  A BMI of 25 to 29.9 is considered overweight.  A BMI of 30 and above is considered obese.  Maintain normal blood lipids and cholesterol levels by exercising and minimizing your intake of saturated fat. Eat a balanced diet with plenty of fruit and vegetables. Blood tests for lipids and cholesterol should begin at age 32 and be repeated every 5 years. If your lipid or cholesterol levels are high, you are over 50, or you are at high risk for heart disease, you may need your cholesterol levels checked more frequently.Ongoing high lipid and cholesterol levels should be treated with medicines if diet and exercise are not effective.  If you smoke, find out from your caregiver how to quit. If you do not use tobacco, do not start.  If you are pregnant, do not drink alcohol. If you are breastfeeding, be very cautious about drinking alcohol. If you are not pregnant and choose to drink alcohol, do not exceed 1 drink per day. One drink is considered to be 12 ounces (355 mL) of beer, 5 ounces (148 mL) of wine, or 1.5 ounces (44 mL) of liquor.  Avoid use of street drugs. Do not share needles with anyone. Ask for help if you need support or instructions about stopping the use of drugs.  High blood pressure causes heart disease and increases the risk of stroke. Your blood pressure should be checked at least every 1 to 2  years. Ongoing high blood pressure should be treated with medicines if weight loss and exercise are not effective.  If you are 8 to 45 years old, ask your caregiver if you should take aspirin to prevent strokes.  Diabetes screening involves taking a blood sample to check your fasting blood sugar level. This should be done once every 3 years, after age 34, if you are within normal weight and without risk factors for diabetes. Testing should be considered at a younger age or be  carried out more frequently if you are overweight and have at least 1 risk factor for diabetes.  Breast cancer screening is essential preventive care for women. You should practice "breast self-awareness." This means understanding the normal appearance and feel of your breasts and may include breast self-examination. Any changes detected, no matter how small, should be reported to a caregiver. Women in their 60s and 30s should have a clinical breast exam (CBE) by a caregiver as part of a regular health exam every 1 to 3 years. After age 69, women should have a CBE every year. Starting at age 53, women should consider having a mammography (breast X-ray test) every year. Women who have a family history of breast cancer should talk to their caregiver about genetic screening. Women at a high risk of breast cancer should talk to their caregivers about having magnetic resonance imaging (MRI) and a mammography every year.  The Pap test is a screening test for cervical cancer. A Pap test can show cell changes on the cervix that might become cervical cancer if left untreated. A Pap test is a procedure in which cells are obtained and examined from the lower end of the uterus (cervix).  Women should have a Pap test starting at age 65.  Between ages 58 and 57, Pap tests should be repeated every 2 years.  Beginning at age 11, you should have a Pap test every 3 years as long as the past 3 Pap tests have been normal.  Some women have medical problems that increase the chance of getting cervical cancer. Talk to your caregiver about these problems. It is especially important to talk to your caregiver if a new problem develops soon after your last Pap test. In these cases, your caregiver may recommend more frequent screening and Pap tests.  The above recommendations are the same for women who have or have not gotten the vaccine for human papillomavirus (HPV).  If you had a hysterectomy for a problem that was not  cancer or a condition that could lead to cancer, then you no longer need Pap tests. Even if you no longer need a Pap test, a regular exam is a good idea to make sure no other problems are starting.  If you are between ages 73 and 72, and you have had normal Pap tests going back 10 years, you no longer need Pap tests. Even if you no longer need a Pap test, a regular exam is a good idea to make sure no other problems are starting.  If you have had past treatment for cervical cancer or a condition that could lead to cancer, you need Pap tests and screening for cancer for at least 20 years after your treatment.  If Pap tests have been discontinued, risk factors (such as a new sexual partner) need to be reassessed to determine if screening should be resumed.  The HPV test is an additional test that may be used for cervical cancer screening. The HPV test looks  for the virus that can cause the cell changes on the cervix. The cells collected during the Pap test can be tested for HPV. The HPV test could be used to screen women aged 46 years and older, and should be used in women of any age who have unclear Pap test results. After the age of 29, women should have HPV testing at the same frequency as a Pap test.  Colorectal cancer can be detected and often prevented. Most routine colorectal cancer screening begins at the age of 43 and continues through age 13. However, your caregiver may recommend screening at an earlier age if you have risk factors for colon cancer. On a yearly basis, your caregiver may provide home test kits to check for hidden blood in the stool. Use of a small camera at the end of a tube, to directly examine the colon (sigmoidoscopy or colonoscopy), can detect the earliest forms of colorectal cancer. Talk to your caregiver about this at age 34, when routine screening begins. Direct examination of the colon should be repeated every 5 to 10 years through age 43, unless early forms of pre-cancerous  polyps or small growths are found.  Hepatitis C blood testing is recommended for all people born from [birth year] through 1965 and any individual with known risks for hepatitis C.  Practice safe sex. Use condoms and avoid high-risk sexual practices to reduce the spread of sexually transmitted infections (STIs). STIs include gonorrhea, chlamydia, syphilis, trichomonas, herpes, HPV, and human immunodeficiency virus (HIV). Herpes, HIV, and HPV are viral illnesses that have no cure. They can result in disability, cancer, and death. Sexually active women aged 32 and younger should be checked for chlamydia. Older women with new or multiple partners should also be tested for chlamydia. Testing for other STIs is recommended if you are sexually active and at increased risk.  Osteoporosis is a disease in which the bones lose minerals and strength with aging. This can result in serious bone fractures. The risk of osteoporosis can be identified using a bone density scan. Women ages 44 and over and women at risk for fractures or osteoporosis should discuss screening with their caregivers. Ask your caregiver whether you should take a calcium supplement or vitamin D to reduce the rate of osteoporosis.  Menopause can be associated with physical symptoms and risks. Hormone replacement therapy is available to decrease symptoms and risks. You should talk to your caregiver about whether hormone replacement therapy is right for you.  Use sunscreen with sun protection factor (SPF) of 30 or more. Apply sunscreen liberally and repeatedly throughout the day. You should seek shade when your shadow is shorter than you. Protect yourself by wearing long sleeves, pants, a wide-brimmed hat, and sunglasses year round, whenever you are outdoors.  Once a month, do a whole body skin exam, using a mirror to look at the skin on your back. Notify your caregiver of new moles, moles that have irregular borders, moles that are larger than a pencil  eraser, or moles that have changed in shape or color.  Stay current with required immunizations.  Influenza. You need a dose every fall (or winter). The composition of the flu vaccine changes each year, so being vaccinated once is not enough.  Pneumococcal polysaccharide. You need 1 to 2 doses if you smoke cigarettes or if you have certain chronic medical conditions. You need 1 dose at age 11 (or older) if you have never been vaccinated.  Tetanus, diphtheria, pertussis (Tdap, Td). Get 1 dose  of Tdap vaccine if you are younger than age 67, are over 16 and have contact with an infant, are a Dietitian, are pregnant, or simply want to be protected from whooping cough. After that, you need a Td booster dose every 10 years. Consult your caregiver if you have not had at least 3 tetanus and diphtheria-containing shots sometime in your life or have a deep or dirty wound.  HPV. You need this vaccine if you are a woman age 53 or younger. The vaccine is given in 3 doses over 6 months.  Measles, mumps, rubella (MMR). You need at least 1 dose of MMR if you were born in 1957 or later. You may also need a second dose.  Meningococcal. If you are age 28 to 57 and a first-year college student living in a residence hall, or have one of several medical conditions, you need to get vaccinated against meningococcal disease. You may also need additional booster doses.  Zoster (shingles). If you are age 45 or older, you should get this vaccine.  Varicella (chickenpox). If you have never had chickenpox or you were vaccinated but received only 1 dose, talk to your caregiver to find out if you need this vaccine.  Hepatitis A. You need this vaccine if you have a specific risk factor for hepatitis A virus infection or you simply wish to be protected from this disease. The vaccine is usually given as 2 doses, 6 to 18 months apart.  Hepatitis B. You need this vaccine if you have a specific risk factor for hepatitis B  virus infection or you simply wish to be protected from this disease. The vaccine is given in 3 doses, usually over 6 months. Preventive Services / Frequency Ages 52 to 14  Blood pressure check.** / Every 1 to 2 years.  Lipid and cholesterol check.** / Every 5 years beginning at age 67.  Clinical breast exam.** / Every 3 years for women in their 53s and 46s.  Pap test.** / Every 2 years from ages 12 through 95. Every 3 years starting at age 73 through age 42 or 38 with a history of 3 consecutive normal Pap tests.  HPV screening.** / Every 3 years from ages 11 through ages 82 to 72 with a history of 3 consecutive normal Pap tests.  Hepatitis C blood test.** / For any individual with known risks for hepatitis C.  Skin self-exam. / Monthly.  Influenza immunization.** / Every year.  Pneumococcal polysaccharide immunization.** / 1 to 2 doses if you smoke cigarettes or if you have certain chronic medical conditions.  Tetanus, diphtheria, pertussis (Tdap, Td) immunization. / A one-time dose of Tdap vaccine. After that, you need a Td booster dose every 10 years.  HPV immunization. / 3 doses over 6 months, if you are 79 and younger.  Measles, mumps, rubella (MMR) immunization. / You need at least 1 dose of MMR if you were born in 1957 or later. You may also need a second dose.  Meningococcal immunization. / 1 dose if you are age 30 to 62 and a first-year college student living in a residence hall, or have one of several medical conditions, you need to get vaccinated against meningococcal disease. You may also need additional booster doses.  Varicella immunization.** / Consult your caregiver.  Hepatitis A immunization.** / Consult your caregiver. 2 doses, 6 to 18 months apart.  Hepatitis B immunization.** / Consult your caregiver. 3 doses usually over 6 months. Ages 66 to 71  Blood  pressure check.** / Every 1 to 2 years.  Lipid and cholesterol check.** / Every 5 years beginning at age  89.  Clinical breast exam.** / Every year after age 76.  Mammogram.** / Every year beginning at age 32 and continuing for as long as you are in good health. Consult with your caregiver.  Pap test.** / Every 3 years starting at age 40 through age 66 or 35 with a history of 3 consecutive normal Pap tests.  HPV screening.** / Every 3 years from ages 88 through ages 66 to 33 with a history of 3 consecutive normal Pap tests.  Fecal occult blood test (FOBT) of stool. / Every year beginning at age 38 and continuing until age 23. You may not need to do this test if you get a colonoscopy every 10 years.  Flexible sigmoidoscopy or colonoscopy.** / Every 5 years for a flexible sigmoidoscopy or every 10 years for a colonoscopy beginning at age 59 and continuing until age 2.  Hepatitis C blood test.** / For all people born from [birth year] through 1963 and any individual with known risks for hepatitis C.  Skin self-exam. / Monthly.  Influenza immunization.** / Every year.  Pneumococcal polysaccharide immunization.** / 1 to 2 doses if you smoke cigarettes or if you have certain chronic medical conditions.  Tetanus, diphtheria, pertussis (Tdap, Td) immunization.** / A one-time dose of Tdap vaccine. After that, you need a Td booster dose every 10 years.  Measles, mumps, rubella (MMR) immunization. / You need at least 1 dose of MMR if you were born in 1957 or later. You may also need a second dose.  Varicella immunization.** / Consult your caregiver.  Meningococcal immunization.** / Consult your caregiver.  Hepatitis A immunization.** / Consult your caregiver. 2 doses, 6 to 18 months apart.  Hepatitis B immunization.** / Consult your caregiver. 3 doses, usually over 6 months. Ages 52 and over  Blood pressure check.** / Every 1 to 2 years.  Lipid and cholesterol check.** / Every 5 years beginning at age 63.  Clinical breast exam.** / Every year after age 22.  Mammogram.** / Every year beginning at  age 3 and continuing for as long as you are in good health. Consult with your caregiver.  Pap test.** / Every 3 years starting at age 108 through age 48 or 80 with a 3 consecutive normal Pap tests. Testing can be stopped between 65 and 70 with 3 consecutive normal Pap tests and no abnormal Pap or HPV tests in the past 10 years.  HPV screening.** / Every 3 years from ages 38 through ages 22 or 44 with a history of 3 consecutive normal Pap tests. Testing can be stopped between 65 and 70 with 3 consecutive normal Pap tests and no abnormal Pap or HPV tests in the past 10 years.  Fecal occult blood test (FOBT) of stool. / Every year beginning at age 33 and continuing until age 46. You may not need to do this test if you get a colonoscopy every 10 years.  Flexible sigmoidoscopy or colonoscopy.** / Every 5 years for a flexible sigmoidoscopy or every 10 years for a colonoscopy beginning at age 41 and continuing until age 84.  Hepatitis C blood test.** / For all people born from [birth year] through 1963 and any individual with known risks for hepatitis C.  Osteoporosis screening.** / A one-time screening for women ages 82 and over and women at risk for fractures or osteoporosis.  Skin self-exam. / Monthly.  Influenza  immunization.** / Every year.  Pneumococcal polysaccharide immunization.** / 1 dose at age 62 (or older) if you have never been vaccinated.  Tetanus, diphtheria, pertussis (Tdap, Td) immunization. / A one-time dose of Tdap vaccine if you are over 65 and have contact with an infant, are a Research scientist (physical sciences), or simply want to be protected from whooping cough. After that, you need a Td booster dose every 10 years.  Varicella immunization.** / Consult your caregiver.  Meningococcal immunization.** / Consult your caregiver.  Hepatitis A immunization.** / Consult your caregiver. 2 doses, 6 to 18 months apart.  Hepatitis B immunization.** / Check with your caregiver. 3 doses, usually over 6  months. ** Family history and personal history of risk and conditions may change your caregiver's recommendations. Document Released: 02/10/2002 Document Revised: 03/08/2012 Document Reviewed: 05/12/2011 [facility] Patient Information 2014 [place], [place].

## 2013-06-17 NOTE — Progress Notes (Signed)
Chief Complaint  Patient presents with  . Annual Exam  . Hypothyroidism    HPI: Patient comes in today for Preventive Health Care visit  No major change in health status since last visit . Period  A bit irregular   Last   Any where form 3 - 7 days.   No sig pain  As before.  Vistaril prn for sleep.    ocass  Xanax.  Soc etoh No tobacc Neg rd.  caffiene  1-2 per day.  No exercise-induced symptoms Palpitations are pretty much gone occasional nonexercise related. Going to [place] in the fall asks about travel advice.  Up-to-date on tetanus ROS:  GEN/ HEENT: No fever, significant weight changes sweats headaches vision problems hearing changes, CV/ PULM; No chest pain shortness of breath cough, syncope,edema  change in exercise tolerance. GI /GU: No adominal pain, vomiting, change in bowel habits. No blood in the stool. No significant GU symptoms. SKIN/HEME: ,no acute skin rashes suspicious lesions or bleeding. No lymphadenopathy, nodules, masses.  NEURO/ PSYCH:  No neurologic signs such as weakness numbness. No depression  IMM/ Allergy: No unusual infections.  Allergy .   REST of 12 system review negative except as per HPI   Past Medical History  Diagnosis Date  . Anxiety   . Elevated blood pressure reading   . Thyroid disease     unsure if sustained never treated  . Hx of varicella     Family History  Problem Relation Age of Onset  . Hyperlipidemia Mother   . Hypertension Mother   . Hyperlipidemia Father   . Hypertension Father   . Celiac disease Father   . Alcohol abuse Maternal Grandmother   . Heart disease Maternal Grandfather   . Alcohol abuse Paternal Grandmother   . Alcohol abuse Paternal Grandfather   . Melanoma      uncle     History   Social History  . Marital Status: Single    Spouse Name: N/A    Number of Children: N/A  . Years of Education: N/A   Social History Main Topics  . Smoking status: Never Smoker   . Smokeless tobacco: None  . Alcohol  Use: Yes     Comment: 2-3 a month  . Drug Use: No  . Sexually Active: None   Other Topics Concern  . None   Social History Narrative   Single college educated    [employer] office  About 45 - 50 per week.    h h of 1  No pets currently   Exercising  Regularly recently at least 4 x per week     6-7 hours sleep    No falls .  Has smoke detector and wears seat belts.  No firearms. No excess sun exposure. . No depression. Partial dentures upper   caffine in am  etoh 2-3 month   Vegetarian diet recently                 Outpatient Encounter Prescriptions as of 06/17/2013  Medication Sig Dispense Refill  . ALPRAZolam (XANAX) 0.5 MG tablet Take 0.5 mg by mouth 4 (four) times daily as needed.       . fish oil-omega-3 fatty acids 1000 MG capsule Take 2 g by mouth daily.      . hydrOXYzine (VISTARIL) 50 MG capsule       . levothyroxine (SYNTHROID, LEVOTHROID) 50 MCG tablet TAKE 1 TABLET BY MOUTH ONCE DAILY  30 tablet  4  .  levothyroxine (SYNTHROID, LEVOTHROID) 50 MCG tablet TAKE 1 TABLET BY MOUTH EVERY DAY  30 tablet  3  . MULTIPLE VITAMIN PO Take by mouth.      . [DISCONTINUED] aspirin 81 MG tablet Take 160 mg by mouth daily.      . [DISCONTINUED] Flaxseed, Linseed, (FLAX SEED OIL) 1000 MG CAPS Take by mouth.       No facility-administered encounter medications on file as of 06/17/2013.    EXAM:  BP 130/80  Pulse 115  Temp(Src) 98.5 F (36.9 C) (Oral)  Ht 5\' 4"  (1.626 m)  Wt 194 lb (87.998 kg)  BMI 33.28 kg/m2  SpO2 98%  LMP 05/27/2013  Body mass index is 33.28 kg/(m^2).  Physical Exam: Vital signs reviewed [id number] is a well-developed well-nourished alert cooperative   female who appears her stated age in no acute distress.  HEENT: normocephalic atraumatic , Eyes: PERRL EOM's full, conjunctiva clear, Nares: paten,t no deformity discharge or tenderness., Ears: no deformity EAC's clear TMs with normal landmarks. Mouth: clear OP, no lesions, edema.  Moist  mucous membranes. Dentition in adequate repair. NECK: supple without masses, or bruits. Thyromegaly no specific nodules or tenderness obvious. CHEST/PULM:  Clear to auscultation and percussion breath sounds equal no wheeze , rales or rhonchi. No chest wall deformities or tenderness. CV: PMI is nondisplaced, S1 S2 no gallops, murmurs, rubs. Peripheral pulses are full without delay.No JVD .  Breast: normal by inspection . No dimpling, discharge, masses, tenderness or discharge . ABDOMEN: Bowel sounds normal nontender  No guard or rebound, no hepato splenomegal no CVA tenderness.  No hernia. Extremtities:  No clubbing cyanosis or edema, no acute joint swelling or redness no focal atrophy NEURO:  Oriented x3, cranial nerves 3-12 appear to be intact, no obvious focal weakness,gait within normal limits no abnormal reflexes or asymmetrical SKIN: No acute rashes normal turgor, color, no bruising or petechiae. PSYCH: Oriented, good eye contact, no obvious depression , cognition and judgment appear normal. mildy anxious  LN: no cervical axillary inguinal adenopathy  Lab Results  Component Value Date   WBC 8.3 02/06/2012   HGB 15.3* 02/06/2012   HCT 44.6 02/06/2012   PLT 293.0 02/06/2012   GLUCOSE 89 02/06/2012   CHOL 211* 02/06/2012   TRIG 79.0 02/06/2012   HDL 66.30 02/06/2012   LDLDIRECT 131.0 02/06/2012   ALT 13 02/06/2012   AST 19 02/06/2012   NA 139 02/06/2012   K 4.2 02/06/2012   CL 106 02/06/2012   CREATININE 0.8 02/06/2012   BUN 10 02/06/2012   CO2 27 02/06/2012   TSH 2.67 05/07/2012    ASSESSMENT AND PLAN:  Discussed the following assessment and plan:  Visit for preventive health examination - Plan: hydrOXYzine (VISTARIL) 50 MG capsule, Basic metabolic panel, CBC with Differential, TSH, Hepatic function panel, Lipid panel, T4, free  Hashimoto's thyroiditis - Plan: hydrOXYzine (VISTARIL) 50 MG capsule, Basic metabolic panel, CBC with Differential, TSH, Hepatic function panel, Lipid panel, T4, free  Need for  prophylactic vaccination and inoculation against viral hepatitis - Plan: Hepatitis A vaccine adult IM Travel [employer] discussed hepatitis A get a flu vaccine before she leaves the country. If laboratory tests are good to return for preventive visit and thyroid monitoring in one year. Continue healthy lifestyle weight loss. Discussed pros and cons of mammograms before age 58 Patient Care Team: Madelin Headings, MD as PCP - General (Internal Medicine) Rupinder Stevan Born, MD as Attending Physician (Psychiatry) Patient Instructions  Continue lifestyle intervention healthy eating  and exercise .  Hep a today   Second in 6 months  Check on [email or web address]  For travel advice. Will notify you  of labs when available.  Preventive in a year  Preventive Care for Adults, Female A healthy lifestyle and preventive care can promote health and wellness. Preventive health guidelines for women include the following key practices.  A routine yearly physical is a good way to check with your caregiver about your health and preventive screening. It is a chance to share any concerns and updates on your health, and to receive a thorough exam.  Visit your dentist for a routine exam and preventive care every 6 months. Brush your teeth twice a day and floss once a day. Good oral hygiene prevents tooth decay and gum disease.  The frequency of eye exams is based on your age, health, family medical history, use of contact lenses, and other factors. Follow your caregiver's recommendations for frequency of eye exams.  Eat a healthy diet. Foods like vegetables, fruits, whole grains, low-fat dairy products, and lean protein foods contain the nutrients you need without too many calories. Decrease your intake of foods high in solid fats, added sugars, and salt. Eat the right amount of calories for you.Get information about a proper diet from your caregiver, if necessary.  Regular physical exercise is one of the most important things you can  do for your health. Most adults should get at least 150 minutes of moderate-intensity exercise (any activity that increases your heart rate and causes you to sweat) each week. In addition, most adults need muscle-strengthening exercises on 2 or more days a week.  Maintain a healthy weight. The body mass index (BMI) is a screening tool to identify possible weight problems. It provides an estimate of body fat based on height and weight. Your caregiver can help determine your BMI, and can help you achieve or maintain a healthy weight.For adults 20 years and older:  A BMI below 18.5 is considered underweight.  A BMI of 18.5 to 24.9 is normal.  A BMI of 25 to 29.9 is considered overweight.  A BMI of 30 and above is considered obese.  Maintain normal blood lipids and cholesterol levels by exercising and minimizing your intake of saturated fat. Eat a balanced diet with plenty of fruit and vegetables. Blood tests for lipids and cholesterol should begin at age 83 and be repeated every 5 years. If your lipid or cholesterol levels are high, you are over 50, or you are at high risk for heart disease, you may need your cholesterol levels checked more frequently.Ongoing high lipid and cholesterol levels should be treated with medicines if diet and exercise are not effective.  If you smoke, find out from your caregiver how to quit. If you do not use tobacco, do not start.  If you are pregnant, do not drink alcohol. If you are breastfeeding, be very cautious about drinking alcohol. If you are not pregnant and choose to drink alcohol, do not exceed 1 drink per day. One drink is considered to be 12 ounces (355 mL) of beer, 5 ounces (148 mL) of wine, or 1.5 ounces (44 mL) of liquor.  Avoid use of street drugs. Do not share needles with anyone. Ask for help if you need support or instructions about stopping the use of drugs.  High blood pressure causes heart disease and increases the risk of stroke. Your blood  pressure should be checked at least every 1 to 2 years. Ongoing high blood  pressure should be treated with medicines if weight loss and exercise are not effective.  If you are 46 to 45 years old, ask your caregiver if you should take aspirin to prevent strokes.  Diabetes screening involves taking a blood sample to check your fasting blood sugar level. This should be done once every 3 years, after age 95, if you are within normal weight and without risk factors for diabetes. Testing should be considered at a younger age or be carried out more frequently if you are overweight and have at least 1 risk factor for diabetes.  Breast cancer screening is essential preventive care for women. You should practice "breast self-awareness." This means understanding the normal appearance and feel of your breasts and may include breast self-examination. Any changes detected, no matter how small, should be reported to a caregiver. Women in their 80s and 30s should have a clinical breast exam (CBE) by a caregiver as part of a regular health exam every 1 to 3 years. After age 39, women should have a CBE every year. Starting at age 66, women should consider having a mammography (breast X-ray test) every year. Women who have a family history of breast cancer should talk to their caregiver about genetic screening. Women at a high risk of breast cancer should talk to their caregivers about having magnetic resonance imaging (MRI) and a mammography every year.  The Pap test is a screening test for cervical cancer. A Pap test can show cell changes on the cervix that might become cervical cancer if left untreated. A Pap test is a procedure in which cells are obtained and examined from the lower end of the uterus (cervix).  Women should have a Pap test starting at age 34.  Between ages 96 and 2, Pap tests should be repeated every 2 years.  Beginning at age 63, you should have a Pap test every 3 years as long as the past 3 Pap  tests have been normal.  Some women have medical problems that increase the chance of getting cervical cancer. Talk to your caregiver about these problems. It is especially important to talk to your caregiver if a new problem develops soon after your last Pap test. In these cases, your caregiver may recommend more frequent screening and Pap tests.  The above recommendations are the same for women who have or have not gotten the vaccine for human papillomavirus (HPV).  If you had a hysterectomy for a problem that was not cancer or a condition that could lead to cancer, then you no longer need Pap tests. Even if you no longer need a Pap test, a regular exam is a good idea to make sure no other problems are starting.  If you are between ages 110 and 35, and you have had normal Pap tests going back 10 years, you no longer need Pap tests. Even if you no longer need a Pap test, a regular exam is a good idea to make sure no other problems are starting.  If you have had past treatment for cervical cancer or a condition that could lead to cancer, you need Pap tests and screening for cancer for at least 20 years after your treatment.  If Pap tests have been discontinued, risk factors (such as a new sexual partner) need to be reassessed to determine if screening should be resumed.  The HPV test is an additional test that may be used for cervical cancer screening. The HPV test looks for the virus that can  cause the cell changes on the cervix. The cells collected during the Pap test can be tested for HPV. The HPV test could be used to screen women aged 66 years and older, and should be used in women of any age who have unclear Pap test results. After the age of 44, women should have HPV testing at the same frequency as a Pap test.  Colorectal cancer can be detected and often prevented. Most routine colorectal cancer screening begins at the age of 85 and continues through age 47. However, your caregiver may  recommend screening at an earlier age if you have risk factors for colon cancer. On a yearly basis, your caregiver may provide home test kits to check for hidden blood in the stool. Use of a small camera at the end of a tube, to directly examine the colon (sigmoidoscopy or colonoscopy), can detect the earliest forms of colorectal cancer. Talk to your caregiver about this at age 9, when routine screening begins. Direct examination of the colon should be repeated every 5 to 10 years through age 84, unless early forms of pre-cancerous polyps or small growths are found.  Hepatitis C blood testing is recommended for all people born from [birth year] through 1965 and any individual with known risks for hepatitis C.  Practice safe sex. Use condoms and avoid high-risk sexual practices to reduce the spread of sexually transmitted infections (STIs). STIs include gonorrhea, chlamydia, syphilis, trichomonas, herpes, HPV, and human immunodeficiency virus (HIV). Herpes, HIV, and HPV are viral illnesses that have no cure. They can result in disability, cancer, and death. Sexually active women aged 81 and younger should be checked for chlamydia. Older women with new or multiple partners should also be tested for chlamydia. Testing for other STIs is recommended if you are sexually active and at increased risk.  Osteoporosis is a disease in which the bones lose minerals and strength with aging. This can result in serious bone fractures. The risk of osteoporosis can be identified using a bone density scan. Women ages 57 and over and women at risk for fractures or osteoporosis should discuss screening with their caregivers. Ask your caregiver whether you should take a calcium supplement or vitamin D to reduce the rate of osteoporosis.  Menopause can be associated with physical symptoms and risks. Hormone replacement therapy is available to decrease symptoms and risks. You should talk to your caregiver about whether hormone  replacement therapy is right for you.  Use sunscreen with sun protection factor (SPF) of 30 or more. Apply sunscreen liberally and repeatedly throughout the day. You should seek shade when your shadow is shorter than you. Protect yourself by wearing long sleeves, pants, a wide-brimmed hat, and sunglasses year round, whenever you are outdoors.  Once a month, do a whole body skin exam, using a mirror to look at the skin on your back. Notify your caregiver of new moles, moles that have irregular borders, moles that are larger than a pencil eraser, or moles that have changed in shape or color.  Stay current with required immunizations.  Influenza. You need a dose every fall (or winter). The composition of the flu vaccine changes each year, so being vaccinated once is not enough.  Pneumococcal polysaccharide. You need 1 to 2 doses if you smoke cigarettes or if you have certain chronic medical conditions. You need 1 dose at age 59 (or older) if you have never been vaccinated.  Tetanus, diphtheria, pertussis (Tdap, Td). Get 1 dose of Tdap vaccine if you  are younger than age 52, are over 92 and have contact with an infant, are a Research scientist (physical sciences), are pregnant, or simply want to be protected from whooping cough. After that, you need a Td booster dose every 10 years. Consult your caregiver if you have not had at least 3 tetanus and diphtheria-containing shots sometime in your life or have a deep or dirty wound.  HPV. You need this vaccine if you are a woman age 26 or younger. The vaccine is given in 3 doses over 6 months.  Measles, mumps, rubella (MMR). You need at least 1 dose of MMR if you were born in 1957 or later. You may also need a second dose.  Meningococcal. If you are age 43 to 62 and a first-year college student living in a residence hall, or have one of several medical conditions, you need to get vaccinated against meningococcal disease. You may also need additional booster doses.  Zoster  (shingles). If you are age 63 or older, you should get this vaccine.  Varicella (chickenpox). If you have never had chickenpox or you were vaccinated but received only 1 dose, talk to your caregiver to find out if you need this vaccine.  Hepatitis A. You need this vaccine if you have a specific risk factor for hepatitis A virus infection or you simply wish to be protected from this disease. The vaccine is usually given as 2 doses, 6 to 18 months apart.  Hepatitis B. You need this vaccine if you have a specific risk factor for hepatitis B virus infection or you simply wish to be protected from this disease. The vaccine is given in 3 doses, usually over 6 months. Preventive Services / Frequency Ages 35 to 38  Blood pressure check.** / Every 1 to 2 years.  Lipid and cholesterol check.** / Every 5 years beginning at age 25.  Clinical breast exam.** / Every 3 years for women in their 37s and 30s.  Pap test.** / Every 2 years from ages 101 through 52. Every 3 years starting at age 77 through age 68 or 103 with a history of 3 consecutive normal Pap tests.  HPV screening.** / Every 3 years from ages 22 through ages 55 to 92 with a history of 3 consecutive normal Pap tests.  Hepatitis C blood test.** / For any individual with known risks for hepatitis C.  Skin self-exam. / Monthly.  Influenza immunization.** / Every year.  Pneumococcal polysaccharide immunization.** / 1 to 2 doses if you smoke cigarettes or if you have certain chronic medical conditions.  Tetanus, diphtheria, pertussis (Tdap, Td) immunization. / A one-time dose of Tdap vaccine. After that, you need a Td booster dose every 10 years.  HPV immunization. / 3 doses over 6 months, if you are 13 and younger.  Measles, mumps, rubella (MMR) immunization. / You need at least 1 dose of MMR if you were born in 1957 or later. You may also need a second dose.  Meningococcal immunization. / 1 dose if you are age 36 to 56 and a first-year  college student living in a residence hall, or have one of several medical conditions, you need to get vaccinated against meningococcal disease. You may also need additional booster doses.  Varicella immunization.** / Consult your caregiver.  Hepatitis A immunization.** / Consult your caregiver. 2 doses, 6 to 18 months apart.  Hepatitis B immunization.** / Consult your caregiver. 3 doses usually over 6 months. Ages 72 to 38  Blood pressure check.** / Every  1 to 2 years.  Lipid and cholesterol check.** / Every 5 years beginning at age 83.  Clinical breast exam.** / Every year after age 11.  Mammogram.** / Every year beginning at age 24 and continuing for as long as you are in good health. Consult with your caregiver.  Pap test.** / Every 3 years starting at age 72 through age 51 or 29 with a history of 3 consecutive normal Pap tests.  HPV screening.** / Every 3 years from ages 56 through ages 58 to 43 with a history of 3 consecutive normal Pap tests.  Fecal occult blood test (FOBT) of stool. / Every year beginning at age 57 and continuing until age 52. You may not need to do this test if you get a colonoscopy every 10 years.  Flexible sigmoidoscopy or colonoscopy.** / Every 5 years for a flexible sigmoidoscopy or every 10 years for a colonoscopy beginning at age 57 and continuing until age 72.  Hepatitis C blood test.** / For all people born from [birth year] through 1965 and any individual with known risks for hepatitis C.  Skin self-exam. / Monthly.  Influenza immunization.** / Every year.  Pneumococcal polysaccharide immunization.** / 1 to 2 doses if you smoke cigarettes or if you have certain chronic medical conditions.  Tetanus, diphtheria, pertussis (Tdap, Td) immunization.** / A one-time dose of Tdap vaccine. After that, you need a Td booster dose every 10 years.  Measles, mumps, rubella (MMR) immunization. / You need at least 1 dose of MMR if you were born in 1957 or later. You may  also need a second dose.  Varicella immunization.** / Consult your caregiver.  Meningococcal immunization.** / Consult your caregiver.  Hepatitis A immunization.** / Consult your caregiver. 2 doses, 6 to 18 months apart.  Hepatitis B immunization.** / Consult your caregiver. 3 doses, usually over 6 months. Ages 57 and over  Blood pressure check.** / Every 1 to 2 years.  Lipid and cholesterol check.** / Every 5 years beginning at age 68.  Clinical breast exam.** / Every year after age 69.  Mammogram.** / Every year beginning at age 76 and continuing for as long as you are in good health. Consult with your caregiver.  Pap test.** / Every 3 years starting at age 50 through age 45 or 70 with a 3 consecutive normal Pap tests. Testing can be stopped between 65 and 70 with 3 consecutive normal Pap tests and no abnormal Pap or HPV tests in the past 10 years.  HPV screening.** / Every 3 years from ages 40 through ages 71 or 34 with a history of 3 consecutive normal Pap tests. Testing can be stopped between 65 and 70 with 3 consecutive normal Pap tests and no abnormal Pap or HPV tests in the past 10 years.  Fecal occult blood test (FOBT) of stool. / Every year beginning at age 30 and continuing until age 28. You may not need to do this test if you get a colonoscopy every 10 years.  Flexible sigmoidoscopy or colonoscopy.** / Every 5 years for a flexible sigmoidoscopy or every 10 years for a colonoscopy beginning at age 33 and continuing until age 25.  Hepatitis C blood test.** / For all people born from [birth year] through 1965 and any individual with known risks for hepatitis C.  Osteoporosis screening.** / A one-time screening for women ages 38 and over and women at risk for fractures or osteoporosis.  Skin self-exam. / Monthly.  Influenza immunization.** / Every year.  Pneumococcal polysaccharide immunization.** / 1 dose at age 16 (or older) if you have never been vaccinated.  Tetanus,  diphtheria, pertussis (Tdap, Td) immunization. / A one-time dose of Tdap vaccine if you are over 65 and have contact with an infant, are a Research scientist (physical sciences), or simply want to be protected from whooping cough. After that, you need a Td booster dose every 10 years.  Varicella immunization.** / Consult your caregiver.  Meningococcal immunization.** / Consult your caregiver.  Hepatitis A immunization.** / Consult your caregiver. 2 doses, 6 to 18 months apart.  Hepatitis B immunization.** / Check with your caregiver. 3 doses, usually over 6 months. ** Family history and personal history of risk and conditions may change your caregiver's recommendations. Document Released: 02/10/2002 Document Revised: 03/08/2012 Document Reviewed: 05/12/2011 [facility] Patient Information 2014 [place], [place].     Neta Mends. Panosh M.D.  Health Maintenance  Topic Date Due  . Influenza Vaccine  08/29/2013  . Pap Smear  03/06/2015  . Tetanus/tdap  02/05/2022   Health Maintenance Review

## 2013-07-06 ENCOUNTER — Other Ambulatory Visit: Payer: Self-pay | Admitting: Family Medicine

## 2013-07-06 DIAGNOSIS — E063 Autoimmune thyroiditis: Secondary | ICD-10-CM

## 2013-07-14 ENCOUNTER — Encounter: Payer: Self-pay | Admitting: Internal Medicine

## 2013-07-15 ENCOUNTER — Telehealth: Payer: Self-pay | Admitting: Family Medicine

## 2013-07-15 MED ORDER — METHOCARBAMOL 750 MG PO TABS: 750.0000 mg | ORAL_TABLET | Freq: Four times a day (QID) | ORAL | Status: DC

## 2013-07-15 NOTE — Telephone Encounter (Signed)
Spoke to the pt.  Pain started 3 months ago but got better and recently has had no problem.  Until 10 days ago.  She exercised and since then has had pain and spasm.  Has treated with Ibuprofen with no help.  Will call in new medication to [facility] on [address].  Pt notified.

## 2013-07-15 NOTE — Telephone Encounter (Signed)
Contact patient  And get more information . Use ice  And wrap   Usually dont use relaxants but  Can call in robaxin 750  Take 1 qid as needed for muscel spasm  Disp 20    OV next week if  continuing problem

## 2013-07-15 NOTE — Telephone Encounter (Signed)
Error

## 2013-07-22 ENCOUNTER — Telehealth: Payer: Self-pay | Admitting: Internal Medicine

## 2013-07-22 ENCOUNTER — Other Ambulatory Visit: Payer: Self-pay | Admitting: Internal Medicine

## 2013-07-22 NOTE — Telephone Encounter (Signed)
Pt is calling to let MD know the muscle relaxers are working.

## 2013-07-22 NOTE — Telephone Encounter (Signed)
Noted  

## 2013-09-05 ENCOUNTER — Telehealth: Payer: Self-pay | Admitting: Internal Medicine

## 2013-09-05 NOTE — Telephone Encounter (Signed)
[place] is going to [place] 10/01/13. Her menstrual cycle will be right in the middle of this trip. She wants to prevent/postpone it to the week after she returns. Is there any type of birth control - Depo shot? Something that she can use one time to postpone her period from occurring during trip. She is currently on day 2-3 of the current cycle.

## 2013-09-05 NOTE — Telephone Encounter (Signed)
Should pt skip last week of bc pills?

## 2013-09-06 ENCOUNTER — Other Ambulatory Visit: Payer: Self-pay | Admitting: Family Medicine

## 2013-09-06 MED ORDER — NORETHINDRONE ACET-ETHINYL EST 1-20 MG-MCG PO TABS: 1.0000 | ORAL_TABLET | Freq: Every day | ORAL | Status: AC

## 2013-09-06 NOTE — Telephone Encounter (Signed)
i dont see that she is on ocps . She is can extend active  pills  i f not she can start ocps low dose   Such as loestrin 1/20 and take continuously but no guarantees with  bleeding pattern. Start now   [facility] risk if no tobacco or hx of clotting) depoprovera decreases periods but  Not often in the first month and is unpredictable. bleeding

## 2013-09-06 NOTE — Telephone Encounter (Signed)
Instructed to go ahead and start taking the pills as her period should start when she is on vacation.  She will skip the last week.

## 2013-09-06 NOTE — Telephone Encounter (Signed)
New rx sent in to the pharmacy

## 2013-10-14 ENCOUNTER — Other Ambulatory Visit (INDEPENDENT_AMBULATORY_CARE_PROVIDER_SITE_OTHER): Payer: No Typology Code available for payment source

## 2013-10-14 DIAGNOSIS — E063 Autoimmune thyroiditis: Secondary | ICD-10-CM

## 2013-10-17 ENCOUNTER — Encounter: Payer: Self-pay | Admitting: Family Medicine

## 2013-11-03 ENCOUNTER — Other Ambulatory Visit: Payer: Self-pay

## 2014-06-10 ENCOUNTER — Other Ambulatory Visit: Payer: Self-pay | Admitting: Internal Medicine

## 2014-06-12 NOTE — Telephone Encounter (Signed)
Sent to the pharmacy for #14.  Pt not seen in a year.  Has no future appointment scheduled.

## 2014-07-07 ENCOUNTER — Other Ambulatory Visit: Payer: Self-pay | Admitting: Internal Medicine

## 2014-07-11 NOTE — Telephone Encounter (Signed)
Sent to the pharmacy by e-scribe. 

## 2014-08-31 ENCOUNTER — Telehealth: Payer: Self-pay | Admitting: Internal Medicine

## 2014-08-31 NOTE — Telephone Encounter (Signed)
Pt would like misty to return her call. Pt stated she is not sick.

## 2014-09-01 NOTE — Telephone Encounter (Signed)
Spoke to the pt.  She was having neck pain and was unable to move her head.  Stated that today her sx are 80% better and does not think she need anything.  Will call back if sx restart or become worse.

## 2014-09-01 NOTE — Telephone Encounter (Signed)
LMOM for Brittony to return my call.

## 2014-10-28 ENCOUNTER — Other Ambulatory Visit: Payer: Self-pay | Admitting: Internal Medicine

## 2014-10-30 NOTE — Telephone Encounter (Signed)
Sent to the pharmacy by e-scribe.  Pt has upcoming CPX scheduled on 11/24/14.

## 2014-11-24 ENCOUNTER — Encounter: Payer: No Typology Code available for payment source | Admitting: Internal Medicine

## 2014-11-25 ENCOUNTER — Other Ambulatory Visit: Payer: Self-pay | Admitting: Internal Medicine

## 2014-11-27 NOTE — Telephone Encounter (Signed)
Sent to the pharmacy by e-scribe.  Pt has upcoming CPX on 02/23/14

## 2015-02-16 ENCOUNTER — Other Ambulatory Visit: Payer: No Typology Code available for payment source

## 2015-02-22 ENCOUNTER — Other Ambulatory Visit: Payer: Self-pay | Admitting: Internal Medicine

## 2015-02-22 NOTE — Telephone Encounter (Signed)
Pt has upcoming CPX scheduled on 04/25/15.  Sent to the pharmacy by e-scribe.

## 2015-02-23 ENCOUNTER — Encounter: Payer: No Typology Code available for payment source | Admitting: Internal Medicine

## 2015-04-09 ENCOUNTER — Encounter: Payer: Self-pay | Admitting: Internal Medicine

## 2015-04-11 ENCOUNTER — Encounter: Payer: No Typology Code available for payment source | Admitting: Internal Medicine

## 2015-04-12 NOTE — Telephone Encounter (Signed)
Ok to work her in a Friday  Just not back to back with adult cpx  And leave some slots open for day of

## 2015-04-16 ENCOUNTER — Encounter: Payer: Self-pay | Admitting: Internal Medicine

## 2015-04-18 ENCOUNTER — Other Ambulatory Visit: Payer: Self-pay | Admitting: Internal Medicine

## 2015-04-18 ENCOUNTER — Other Ambulatory Visit: Payer: Self-pay

## 2015-04-18 NOTE — Telephone Encounter (Signed)
Sent to the pharmacy by e-scribe. 

## 2015-04-25 ENCOUNTER — Encounter: Payer: Self-pay | Admitting: Internal Medicine

## 2015-04-25 DIAGNOSIS — Z0289 Encounter for other administrative examinations: Secondary | ICD-10-CM

## 2015-04-25 NOTE — Progress Notes (Signed)
Document opened and reviewed for OV but appt  NS same day .   

## 2015-05-16 ENCOUNTER — Other Ambulatory Visit: Payer: Self-pay | Admitting: Internal Medicine

## 2015-05-19 ENCOUNTER — Telehealth: Payer: Self-pay | Admitting: Internal Medicine

## 2015-05-21 NOTE — Telephone Encounter (Signed)
Sent to the pharmacy by e-scribe. 

## 2015-05-21 NOTE — Telephone Encounter (Signed)
Patient called to schedule her CPX for 07/04/15. She would like a re-fill on the below medication to hold her till then.

## 2015-06-18 ENCOUNTER — Other Ambulatory Visit: Payer: Self-pay | Admitting: Internal Medicine

## 2015-06-18 NOTE — Telephone Encounter (Signed)
Denied.  Filled for 2 months on 05/21/15. Pt has appt on 07/04/15

## 2015-06-29 ENCOUNTER — Other Ambulatory Visit (INDEPENDENT_AMBULATORY_CARE_PROVIDER_SITE_OTHER): Payer: No Typology Code available for payment source

## 2015-06-29 DIAGNOSIS — Z0000 Encounter for general adult medical examination without abnormal findings: Secondary | ICD-10-CM

## 2015-06-29 LAB — BASIC METABOLIC PANEL
BUN: 12 mg/dL (ref 6–23)
CALCIUM: 9.8 mg/dL (ref 8.4–10.5)
CO2: 26 mEq/L (ref 19–32)
Chloride: 103 mEq/L (ref 96–112)
Creatinine, Ser: 0.82 mg/dL (ref 0.40–1.20)
GFR: 79.26 mL/min (ref >60.00)
Glucose, Bld: 97 mg/dL (ref 70–99)
Potassium: 4.6 mEq/L (ref 3.5–5.1)
SODIUM: 139 meq/L (ref 135–145)

## 2015-06-29 LAB — CBC WITH DIFFERENTIAL/PLATELET
BASOS ABS: 0 10*3/uL (ref 0.0–0.1)
BASOS PCT: 0.2 % (ref 0.0–3.0)
EOS PCT: 1.4 % (ref 0.0–5.0)
Eosinophils Absolute: 0.1 10*3/uL (ref 0.0–0.7)
HEMATOCRIT: 44.9 % (ref 36.0–46.0)
Hemoglobin: 14.9 g/dL (ref 12.0–15.0)
LYMPHS ABS: 2.5 10*3/uL (ref 0.7–4.0)
Lymphocytes Relative: 25.2 % (ref 12.0–46.0)
MCHC: 33.2 g/dL (ref 30.0–36.0)
MCV: 92.4 fl (ref 78.0–100.0)
Monocytes Absolute: 0.7 10*3/uL (ref 0.1–1.0)
Monocytes Relative: 7.1 % (ref 3.0–12.0)
NEUTROS ABS: 6.5 10*3/uL (ref 1.4–7.7)
Neutrophils Relative %: 66.1 % (ref 43.0–77.0)
Platelets: 346 10*3/uL (ref 150.0–400.0)
RBC: 4.86 Mil/uL (ref 3.87–5.11)
RDW: 13.6 % (ref 11.5–15.5)
WBC: 9.9 10*3/uL (ref 4.0–10.5)

## 2015-06-29 LAB — LIPID PANEL
CHOL/HDL RATIO: 4
CHOLESTEROL: 212 mg/dL — AB (ref 0–200)
HDL: 58.9 mg/dL (ref >39.00)
LDL Cholesterol: 136 mg/dL — ABNORMAL HIGH (ref 0–99)
NONHDL: 153.1
Triglycerides: 88 mg/dL (ref 0.0–149.0)
VLDL: 17.6 mg/dL (ref 0.0–40.0)

## 2015-06-29 LAB — HEPATIC FUNCTION PANEL
ALT: 11 U/L (ref 0–35)
AST: 16 U/L (ref 0–37)
Albumin: 4 g/dL (ref 3.5–5.2)
Alkaline Phosphatase: 64 U/L (ref 39–117)
BILIRUBIN DIRECT: 0.1 mg/dL (ref 0.0–0.3)
BILIRUBIN TOTAL: 0.4 mg/dL (ref 0.2–1.2)
TOTAL PROTEIN: 8.1 g/dL (ref 6.0–8.3)

## 2015-06-29 LAB — TSH: TSH: 4.3 u[IU]/mL (ref 0.35–4.50)

## 2015-07-04 ENCOUNTER — Other Ambulatory Visit (HOSPITAL_COMMUNITY)
Admission: RE | Admit: 2015-07-04 | Discharge: 2015-07-04 | Disposition: A | Discharge status: Home / Self Care | Payer: No Typology Code available for payment source | Source: Ambulatory Visit | Attending: Internal Medicine | Admitting: Internal Medicine

## 2015-07-04 ENCOUNTER — Encounter: Payer: Self-pay | Admitting: Internal Medicine

## 2015-07-04 ENCOUNTER — Ambulatory Visit (INDEPENDENT_AMBULATORY_CARE_PROVIDER_SITE_OTHER): Payer: No Typology Code available for payment source | Admitting: Internal Medicine

## 2015-07-04 VITALS — BP 140/90 | Temp 98.3°F | Ht 63.25 in | Wt 205.7 lb

## 2015-07-04 DIAGNOSIS — Z1151 Encounter for screening for human papillomavirus (HPV): Secondary | ICD-10-CM | POA: Diagnosis present

## 2015-07-04 DIAGNOSIS — E039 Hypothyroidism, unspecified: Secondary | ICD-10-CM

## 2015-07-04 DIAGNOSIS — IMO0001 Reserved for inherently not codable concepts without codable children: Secondary | ICD-10-CM

## 2015-07-04 DIAGNOSIS — E785 Hyperlipidemia, unspecified: Secondary | ICD-10-CM

## 2015-07-04 DIAGNOSIS — Z01419 Encounter for gynecological examination (general) (routine) without abnormal findings: Secondary | ICD-10-CM | POA: Insufficient documentation

## 2015-07-04 DIAGNOSIS — Z0000 Encounter for general adult medical examination without abnormal findings: Secondary | ICD-10-CM | POA: Diagnosis not present

## 2015-07-04 DIAGNOSIS — B029 Zoster without complications: Secondary | ICD-10-CM

## 2015-07-04 DIAGNOSIS — D259 Leiomyoma of uterus, unspecified: Secondary | ICD-10-CM | POA: Diagnosis not present

## 2015-07-04 DIAGNOSIS — Z780 Asymptomatic menopausal state: Secondary | ICD-10-CM

## 2015-07-04 DIAGNOSIS — R030 Elevated blood-pressure reading, without diagnosis of hypertension: Secondary | ICD-10-CM

## 2015-07-04 NOTE — Patient Instructions (Addendum)
Get urine and blood pressure monitor and check it when you're not in the office Take blood pressure readings twice a day for 10 - 14  days and then periodically .To ensure below 140/90   .Send in readings     Or rov.  Your fibroids are quite large  Uncertain if larger or not.   I think they should be followed and checked by GYNE .    Take the thyroid med 1 HOur pre food  Recheck tsh  In about 3 months the ROV and will  Fu on blood pressure  Bring in monitor to visit   Healthy lifestyle includes : At least 150 minutes of exercise weeks  , weight at healthy levels, which is usually   BMI 19-25. Avoid trans fats and processed foods;  Increase fresh fruits and veges to 5 servings per day. And avoid sweet beverages including tea and juice. Mediterranean diet with olive oil and nuts have been noted to be heart and brain healthy . Avoid tobacco products . Limit  alcohol to  7 per week for women and 14 servings for men.  Get adequate sleep . Wear seat belts . Don't text and drive .      Why follow it? Research shows. . Those who follow the Mediterranean diet have a reduced risk of heart disease  . The diet is associated with a reduced incidence of Parkinson's and Alzheimer's diseases . People following the diet may have longer life expectancies and lower rates of chronic diseases  . The Dietary Guidelines for Americans recommends the Mediterranean diet as an eating plan to promote health and prevent disease  What Is the Mediterranean Diet?  . Healthy eating plan based on typical foods and recipes of Mediterranean-style cooking . The diet is primarily a plant based diet; these foods should make up a majority of meals   Starches - Plant based foods should make up a majority of meals - They are an important sources of vitamins, minerals, energy, antioxidants, and fiber - Choose whole grains, foods high in fiber and minimally processed items  - Typical grain sources include wheat, oats, barley,  corn, brown rice, bulgar, farro, millet, polenta, couscous  - Various types of beans include chickpeas, lentils, fava beans, black beans, white beans   Fruits  Veggies - Large quantities of antioxidant rich fruits & veggies; 6 or more servings  - Vegetables can be eaten raw or lightly drizzled with oil and cooked  - Vegetables common to the traditional Mediterranean Diet include: artichokes, arugula, beets, broccoli, brussel sprouts, cabbage, carrots, celery, collard greens, cucumbers, eggplant, kale, leeks, lemons, lettuce, mushrooms, okra, onions, peas, peppers, potatoes, pumpkin, radishes, rutabaga, shallots, spinach, sweet potatoes, turnips, zucchini - Fruits common to the Mediterranean Diet include: apples, apricots, avocados, cherries, clementines, dates, figs, grapefruits, grapes, melons, nectarines, oranges, peaches, pears, pomegranates, strawberries, tangerines  Fats - Replace butter and margarine with healthy oils, such as olive oil, canola oil, and tahini  - Limit nuts to no more than a handful a day  - Nuts include walnuts, almonds, pecans, pistachios, pine nuts  - Limit or avoid candied, honey roasted or heavily salted nuts - Olives are central to the [employer] - can be eaten whole or used in a variety of dishes   Meats Protein - Limiting red meat: no more than a few times a month - When eating red meat: choose lean cuts and keep the portion to the size of deck of cards - Eggs:  approx. 0 to 4 times a week  - Fish and lean poultry: at least 2 a week  - Healthy protein sources include, chicken, [place], lean beef, lamb - Increase intake of seafood such as tuna, salmon, trout, mackerel, shrimp, scallops - Avoid or limit high fat processed meats such as sausage and bacon  Dairy - Include moderate amounts of low fat dairy products  - Focus on healthy dairy such as fat free yogurt, skim milk, low or reduced fat cheese - Limit dairy products higher in fat such as whole or 2% milk,  cheese, ice cream  Alcohol - Moderate amounts of red wine is ok  - No more than 5 oz daily for women (all ages) and men older than age 69  - No more than 10 oz of wine daily for men younger than 56  Other - Limit sweets and other desserts  - Use herbs and spices instead of salt to flavor foods  - Herbs and spices common to the traditional Mediterranean Diet include: basil, bay leaves, chives, cloves, cumin, fennel, garlic, lavender, marjoram, mint, oregano, parsley, pepper, rosemary, sage, savory, sumac, tarragon, thyme   It's not just a diet, it's a lifestyle:  . The Mediterranean diet includes lifestyle factors typical of those in the region  . Foods, drinks and meals are best eaten with others and savored . Daily physical activity is important for overall good health . This could be strenuous exercise like running and aerobics . This could also be more leisurely activities such as walking, housework, yard-work, or taking the stairs . Moderation is the key; a balanced and healthy diet accommodates most foods and drinks . Consider portion sizes and frequency of consumption of certain foods   Meal Ideas & Options:  . Breakfast:  o Whole wheat toast or whole wheat English muffins with peanut butter & hard boiled egg o Steel cut oats topped with apples & cinnamon and skim milk  o Fresh fruit: banana, strawberries, melon, berries, peaches  o Smoothies: strawberries, bananas, greek yogurt, peanut butter o Low fat greek yogurt with blueberries and granola  o Egg white omelet with spinach and mushrooms o Breakfast couscous: whole wheat couscous, apricots, skim milk, cranberries  . Sandwiches:  o Hummus and grilled vegetables (peppers, zucchini, squash) on whole wheat bread   o Grilled chicken on whole wheat pita with lettuce, tomatoes, cucumbers or tzatziki  o Tuna salad on whole wheat bread: tuna salad made with greek yogurt, olives, red peppers, capers, green onions o Garlic rosemary lamb  pita: lamb sauted with garlic, rosemary, salt & pepper; add lettuce, cucumber, greek yogurt to pita - flavor with lemon juice and black pepper  . Seafood:  o Mediterranean grilled salmon, seasoned with garlic, basil, parsley, lemon juice and black pepper o Shrimp, lemon, and spinach whole-grain pasta salad made with low fat greek yogurt  o Seared scallops with lemon orzo  o Seared tuna steaks seasoned salt, pepper, coriander topped with tomato mixture of olives, tomatoes, olive oil, minced garlic, parsley, green onions and cappers  . Meats:  o Herbed greek chicken salad with kalamata olives, cucumber, feta  o Red bell peppers stuffed with spinach, bulgur, lean ground beef (or lentils) & topped with feta   o Kebabs: skewers of chicken, tomatoes, onions, zucchini, squash  o [place] burgers: made with red onions, mint, dill, lemon juice, feta cheese topped with roasted red peppers . Vegetarian o Cucumber salad: cucumbers, artichoke hearts, celery, red onion, feta cheese, tossed  in olive oil & lemon juice  o Hummus and whole grain pita points with a greek salad (lettuce, tomato, feta, olives, cucumbers, red onion) o Lentil soup with celery, carrots made with vegetable broth, garlic, salt and pepper  o Tabouli salad: parsley, bulgur, mint, scallions, cucumbers, tomato, radishes, lemon juice, olive oil, salt and pepper.

## 2015-07-04 NOTE — Progress Notes (Signed)
Chief Complaint  Patient presents with  . Annual Exam    HPI: Patient  Christina Mccormick  49 y.o. comes in today for [place] visit  And fu number of conditions   LMp June 14 small spots previous ok   Vary up to 45 days.   Rash resolved tihgt flank poss shingles itched and hurt. Better now.   ocass anal bleed with hard bms no hematochezia  Or other bleeding   Bp goes up and down   Taking  thryoid med daily but about 30 pre meal . Doesn't miss doses much   Health Maintenance  Topic Date Due  . PAP SMEAR  03/06/2015  . HIV Screening  06/28/2016 (Originally 01/11/1981)  . INFLUENZA VACCINE  07/30/2015  . TETANUS/TDAP  02/05/2022   Health Maintenance Review LIFESTYLE:  Exercise:   Tobacco/ETS: Alcohol: per day no Sugar beverages: Sleep: 6-7 hours  Drug use: no  ROS:  See hpi   GEN/ HEENT: No fever, significant weight changes sweats headaches vision problems hearing changes, CV/ PULM; No chest pain shortness of breath cough, syncope,edema  change in exercise tolerance. GI /GU: No adominal pain, vomiting, change in bowel habits. No blood in the stool. No significant GU symptoms. SKIN/HEME: ,no acute skin rashes suspicious lesions or bleeding. No lymphadenopathy, nodules, masses.  NEURO/ PSYCH:  No neurologic signs such as weakness numbness. No depression anxiety. IMM/ Allergy: No unusual infections.  Allergy .   REST of 12 system review negative except as per HPI   Past Medical History  Diagnosis Date  . Anxiety   . Elevated blood pressure reading   . Thyroid disease     unsure if sustained never treated  . Hx of varicella     Past Surgical History  Procedure Laterality Date  . Tonsillectomy and adenoidectomy    . Tympanostomy tube placement      Family History  Problem Relation Age of Onset  . Hyperlipidemia Mother   . Hypertension Mother   . Hyperlipidemia Father   . Hypertension Father   . Celiac disease Father   . Alcohol abuse Maternal  Grandmother   . Heart disease Maternal Grandfather   . Alcohol abuse Paternal Grandmother   . Alcohol abuse Paternal Grandfather   . Melanoma      uncle     History   Social History  . Marital Status: Single    Spouse Name: N/A  . Number of Children: N/A  . Years of Education: N/A   Social History Main Topics  . Smoking status: Never Smoker   . Smokeless tobacco: Never Used  . Alcohol Use: 0.0 oz/week    0 Standard drinks or equivalent per week     Comment: 2-3 a month  . Drug Use: No  . Sexual Activity: Not on file   Other Topics Concern  . None   Social History Narrative   Single college educated    [employer]  About 27 - 63 per week.    h h of 1  No pets currently   Exercising  Regularly recently at least 4 x per week     6-7 hours sleep    No falls .  Has smoke detector and wears seat belts.  No firearms. No excess sun exposure. . No depression. Partial dentures upper   caffine in am  etoh 2-3 month   Vegetarian diet recently  Outpatient Prescriptions Prior to Visit  Medication Sig Dispense Refill  . ALPRAZolam (XANAX) 0.5 MG tablet Take 0.5 mg by mouth 4 (four) times daily as needed.     . fish oil-omega-3 fatty acids 1000 MG capsule Take 2 g by mouth daily.    Marland Kitchen levothyroxine (SYNTHROID, LEVOTHROID) 50 MCG tablet TAKE 1 TABLET BY MOUTH ONCE DAILY 30 tablet 4  . levothyroxine (SYNTHROID, LEVOTHROID) 50 MCG tablet TAKE 1 TABLET BY MOUTH EVERY DAY IN THE MORNING BEFORE BREAKFAST 30 tablet 1  . MULTIPLE VITAMIN PO Take by mouth.    . norethindrone-ethinyl estradiol (MICROGESTIN,JUNEL,LOESTRIN) 1-20 MG-MCG tablet Take 1 tablet by mouth daily. 1 Package 2  . hydrOXYzine (VISTARIL) 50 MG capsule     . methocarbamol (ROBAXIN) 750 MG tablet Take 1 tablet (750 mg total) by mouth 4 (four) times daily. 20 tablet 0   No facility-administered medications prior to visit.     EXAM:  BP 140/90 mmHg  Temp(Src) 98.3 F (36.8 C)  (Oral)  Ht 5' 3.25" (1.607 m)  Wt 205 lb 11.2 oz (93.305 kg)  BMI 36.13 kg/m2  LMP 06/12/2015  Body mass index is 36.13 kg/(m^2).  Physical Exam: Vital signs reviewed AGT:XMIW is a well-developed well-nourished alert cooperative    who appearsr stated age in no acute distress.  HEENT: normocephalic atraumatic , Eyes: PERRL EOM's full, conjunctiva clear, Nares: paten,t no deformity discharge or tenderness., Ears: no deformity EAC's clear TMs with normal landmarks. Mouth: clear OP, no lesions, edema.  Moist mucous membranes. Dentition in adequate repair. NECK: supple without masses, thyromegaly or bruits. CHEST/PULM:  Clear to auscultation and percussion breath sounds equal no wheeze , rales or rhonchi. No chest wall deformities or tenderness. Breast: normal by inspection . No dimpling, discharge, masses, tenderness or discharge . CV: PMI is nondisplaced, S1 S2 no gallops, murmurs, rubs. Peripheral pulses are full without delay.No JVD .  ABDOMEN: Bowel sounds normal nontender  No guard or rebound, no hepato splenomegal no CVA tenderness.  No hernia. Large uterus  Esp on right   No guarding  Or pain . Extremtities:  No clubbing cyanosis or edema, no acute joint swelling or redness no focal atrophy NEURO:  Oriented x3, cranial nerves 3-12 appear to be intact, no obvious focal weakness,gait within normal limits no abnormal reflexes or asymmetrical SKIN: No acute rashes normal turgor, color, no bruising or petechiae.fade rash right flank  cw zoster .  PSYCH: Oriented, good eye contact, no obvious depression anxiety, cognition and judgment appear normal. LN: no cervical axillary inguinal adenopathy Pelvic: NL ext GU, labia clear without lesions or rash . Vagina no lesions .Cervix: clear small os  UTERUS: very enlarged  CMT Adnexa:  clear no masses obvious . PAP done rectal area  Small hem tag no bleeding or fissure   Lab Results  Component Value Date   WBC 9.9 06/29/2015   HGB 14.9 06/29/2015    HCT 44.9 06/29/2015   PLT 346.0 06/29/2015   GLUCOSE 97 06/29/2015   CHOL 212* 06/29/2015   TRIG 88.0 06/29/2015   HDL 58.90 06/29/2015   LDLDIRECT 154.4 06/17/2013   LDLCALC 136* 06/29/2015   ALT 11 06/29/2015   AST 16 06/29/2015   NA 139 06/29/2015   K 4.6 06/29/2015   CL 103 06/29/2015   CREATININE 0.82 06/29/2015   BUN 12 06/29/2015   CO2 26 06/29/2015   TSH 4.30 06/29/2015   BP Readings from Last 3 Encounters:  07/04/15 140/90  06/17/13 130/80  05/07/12 122/90     ASSESSMENT AND PLAN:  Discussed the following assessment and plan:  Visit for preventive health examination - stooll cards   if having recatl bleeding may need to see GI . - Plan: PAP [[place]]  Hypothyroidism, unspecified hypothyroidism type - tsh in 4 range  take med 1hr ppre meal  Uterine leiomyoma, unspecified location - seem much larger than 2 years ago?  no sx  ireeg menses . get gyne referral prob needs repeat US  best directed by specialist.  - Plan: Ambulatory referral to Gynecology  Elevated BP - need fu to ensure at goal or add med   Menopause - ?  consider check fsh  but to see gyne for large fibroid hx   Hyperlipidemia  Encounter for routine gynecological examination - Plan: PAP [[place]]  Shingles rash - fading but classic looking and hx Large fibroids  On last [place]  Patient Care Team: Burnis Medin, MD as PCP - General (Internal Medicine) Chucky May, MD as Attending Physician (Psychiatry) Patient Instructions   Get urine and blood pressure monitor and check it when you're not in the office Take blood pressure readings twice a day for 10 - 14  days and then periodically .To ensure below 140/90   .Send in readings     Or rov.  Your fibroids are quite large  Uncertain if larger or not.   I think they should be followed and checked by GYNE .    Take the thyroid med 1 HOur pre food  Recheck tsh  In about 3 months the ROV and will  Fu on blood pressure  Bring in monitor to visit    Healthy lifestyle includes : At least 150 minutes of exercise weeks  , weight at healthy levels, which is usually   BMI 19-25. Avoid trans fats and processed foods;  Increase fresh fruits and veges to 5 servings per day. And avoid sweet beverages including tea and juice. Mediterranean diet with olive oil and nuts have been noted to be heart and brain healthy . Avoid tobacco products . Limit  alcohol to  7 per week for women and 14 servings for men.  Get adequate sleep . Wear seat belts . Don't text and drive .      Why follow it? Research shows. . Those who follow the Mediterranean diet have a reduced risk of heart disease  . The diet is associated with a reduced incidence of Parkinson's and Alzheimer's diseases . People following the diet may have longer life expectancies and lower rates of chronic diseases  . The Dietary Guidelines for Americans recommends the Mediterranean diet as an eating plan to promote health and prevent disease  What Is the Mediterranean Diet?  . Healthy eating plan based on typical foods and recipes of Mediterranean-style cooking . The diet is primarily a plant based diet; these foods should make up a majority of meals   Starches - Plant based foods should make up a majority of meals - They are an important sources of vitamins, minerals, energy, antioxidants, and fiber - Choose whole grains, foods high in fiber and minimally processed items  - Typical grain sources include wheat, oats, barley, corn, brown rice, bulgar, farro, millet, polenta, couscous  - Various types of beans include chickpeas, lentils, fava beans, black beans, white beans   Fruits  Veggies - Large quantities of antioxidant rich fruits & veggies; 6 or more servings  - Vegetables can be eaten raw  or lightly drizzled with oil and cooked  - Vegetables common to the traditional Mediterranean Diet include: artichokes, arugula, beets, broccoli, brussel sprouts, cabbage, carrots, celery, collard  greens, cucumbers, eggplant, kale, leeks, lemons, lettuce, mushrooms, okra, onions, peas, peppers, potatoes, pumpkin, radishes, rutabaga, shallots, spinach, sweet potatoes, turnips, zucchini - Fruits common to the Mediterranean Diet include: apples, apricots, avocados, cherries, clementines, dates, figs, grapefruits, grapes, melons, nectarines, oranges, peaches, pears, pomegranates, strawberries, tangerines  Fats - Replace butter and margarine with healthy oils, such as olive oil, canola oil, and tahini  - Limit nuts to no more than a handful a day  - Nuts include walnuts, almonds, pecans, pistachios, pine nuts  - Limit or avoid candied, honey roasted or heavily salted nuts - Olives are central to the [employer] - can be eaten whole or used in a variety of dishes   Meats Protein - Limiting red meat: no more than a few times a month - When eating red meat: choose lean cuts and keep the portion to the size of deck of cards - Eggs: approx. 0 to 4 times a week  - Fish and lean poultry: at least 2 a week  - Healthy protein sources include, chicken, [place], lean beef, lamb - Increase intake of seafood such as tuna, salmon, trout, mackerel, shrimp, scallops - Avoid or limit high fat processed meats such as sausage and bacon  Dairy - Include moderate amounts of low fat dairy products  - Focus on healthy dairy such as fat free yogurt, skim milk, low or reduced fat cheese - Limit dairy products higher in fat such as whole or 2% milk, cheese, ice cream  Alcohol - Moderate amounts of red wine is ok  - No more than 5 oz daily for women (all ages) and men older than age 30  - No more than 10 oz of wine daily for men younger than 65  Other - Limit sweets and other desserts  - Use herbs and spices instead of salt to flavor foods  - Herbs and spices common to the traditional Mediterranean Diet include: basil, bay leaves, chives, cloves, cumin, fennel, garlic, lavender, marjoram, mint, oregano, parsley,  pepper, rosemary, sage, savory, sumac, tarragon, thyme   It's not just a diet, it's a lifestyle:  . The Mediterranean diet includes lifestyle factors typical of those in the region  . Foods, drinks and meals are best eaten with others and savored . Daily physical activity is important for overall good health . This could be strenuous exercise like running and aerobics . This could also be more leisurely activities such as walking, housework, yard-work, or taking the stairs . Moderation is the key; a balanced and healthy diet accommodates most foods and drinks . Consider portion sizes and frequency of consumption of certain foods   Meal Ideas & Options:  . Breakfast:  o Whole wheat toast or whole wheat English muffins with peanut butter & hard boiled egg o Steel cut oats topped with apples & cinnamon and skim milk  o Fresh fruit: banana, strawberries, melon, berries, peaches  o Smoothies: strawberries, bananas, greek yogurt, peanut butter o Low fat greek yogurt with blueberries and granola  o Egg white omelet with spinach and mushrooms o Breakfast couscous: whole wheat couscous, apricots, skim milk, cranberries  . Sandwiches:  o Hummus and grilled vegetables (peppers, zucchini, squash) on whole wheat bread   o Grilled chicken on whole wheat pita with lettuce, tomatoes, cucumbers or tzatziki  o [employer] on  whole wheat bread: tuna salad made with greek yogurt, olives, red peppers, capers, green onions o Garlic rosemary lamb pita: lamb sauted with garlic, rosemary, salt & pepper; add lettuce, cucumber, greek yogurt to pita - flavor with lemon juice and black pepper  . Seafood:  o Mediterranean grilled salmon, seasoned with garlic, basil, parsley, lemon juice and black pepper o Shrimp, lemon, and spinach whole-grain pasta salad made with low fat greek yogurt  o Seared scallops with lemon orzo  o Seared tuna steaks seasoned salt, pepper, coriander topped with tomato mixture of olives,  tomatoes, olive oil, minced garlic, parsley, green onions and cappers  . Meats:  o Herbed greek chicken salad with kalamata olives, cucumber, feta  o Red bell peppers stuffed with spinach, bulgur, lean ground beef (or lentils) & topped with feta   o Kebabs: skewers of chicken, tomatoes, onions, zucchini, squash  o [place] burgers: made with red onions, mint, dill, lemon juice, feta cheese topped with roasted red peppers . Vegetarian o Cucumber salad: cucumbers, artichoke hearts, celery, red onion, feta cheese, tossed in olive oil & lemon juice  o Hummus and whole grain pita points with a greek salad (lettuce, tomato, feta, olives, cucumbers, red onion) o Lentil soup with celery, carrots made with vegetable broth, garlic, salt and pepper  o Tabouli salad: parsley, bulgur, mint, scallions, cucumbers, tomato, radishes, lemon juice, olive oil, salt and pepper.         Standley Brooking. Niema Carrara M.D.  Findings:  Uterus: Normally positioned. Large mass extending from the uterine fundus into the lower quadrant of the abdomen on the right. The margins of the mass are difficult to visualize, making measurement difficult. There is a second mass arising exophytically from the anterior uterine fundus, measuring 3.9 x 3.5 x 3.3 cm. A third mass is noted posteriorly on the right, measuring 4.8 x 4.1 x 3.5 cm. None of these masses appeared to have submucosal components. The uterus measures approximately 14.6 x 11.1 x 6.9 cm in maximum dimensions.  Endometrium: Normal in thickness and appearance  Right ovary: Normal, containing a 3.41 cm follicle.  Left ovary: Normal.  Other findings: No free fluid  IMPRESSION: Multiple uterine fibroids, as described above.  Original Report Authenticated By: Gerald Stabs, M.D. 4 26 2013

## 2015-07-09 ENCOUNTER — Telehealth: Payer: Self-pay | Admitting: Obstetrics and Gynecology

## 2015-07-09 LAB — CYTOLOGY - PAP

## 2015-07-09 NOTE — Progress Notes (Signed)
Quick Note:  Tell patient PAP is normal. HPV high risk is negative ______ 

## 2015-07-09 NOTE — Telephone Encounter (Signed)
Called and left a message for patient to call back to schedule a new patient doctor referral. °

## 2015-07-10 NOTE — Telephone Encounter (Signed)
Called and left a message for patient to call back to schedule a new patient doctor referral. °

## 2015-07-11 ENCOUNTER — Encounter: Payer: Self-pay | Admitting: Family Medicine

## 2015-07-11 NOTE — Telephone Encounter (Signed)
Returning a call to [place]. Please  between 2-5 pm .

## 2015-07-20 ENCOUNTER — Other Ambulatory Visit: Payer: Self-pay | Admitting: Internal Medicine

## 2015-07-20 NOTE — Telephone Encounter (Signed)
Sent to the pharmacy by e-scribe. 

## 2015-08-09 ENCOUNTER — Telehealth: Payer: Self-pay | Admitting: Obstetrics and Gynecology

## 2015-08-09 NOTE — Telephone Encounter (Signed)
Called and left a message for patient to call back to schedule a new patient doctor referral. °

## 2015-08-10 ENCOUNTER — Ambulatory Visit: Payer: No Typology Code available for payment source | Admitting: Obstetrics and Gynecology

## 2015-08-10 NOTE — Telephone Encounter (Signed)
Called and left a message for patient to call back to schedule a new patient doctor referral. °

## 2015-08-15 NOTE — Telephone Encounter (Signed)
Called and left a message for patient to call back to schedule a new patient doctor referral. °

## 2015-08-16 NOTE — Telephone Encounter (Signed)
Referral sent back to referring office with the following message:  Hi Dr. Regis Bill,  This patient was scheduled and then cancelled due to "staffing at work." She has not returned multiple calls to reschedule the appointment since then. Please resend this referral when the patient is ready to schedule.  Thank you,  Lavella Hammock

## 2015-10-04 ENCOUNTER — Other Ambulatory Visit: Payer: Self-pay | Admitting: Internal Medicine

## 2015-10-04 ENCOUNTER — Telehealth: Payer: Self-pay | Admitting: Family Medicine

## 2015-10-04 NOTE — Telephone Encounter (Signed)
Pt no showed for appt today with [facility].  Called and left a message for the pt to call office and reschedule.

## 2015-10-04 NOTE — Progress Notes (Unsigned)
No chief complaint on file.   HPI:  Fu bp and thyroid Never went to referral ROS: See pertinent positives and negatives per HPI.  Past Medical History  Diagnosis Date  . Anxiety   . Elevated blood pressure reading   . Thyroid disease     unsure if sustained never treated  . Hx of varicella     Family History  Problem Relation Age of Onset  . Hyperlipidemia Mother   . Hypertension Mother   . Hyperlipidemia Father   . Hypertension Father   . Celiac disease Father   . Alcohol abuse Maternal Grandmother   . Heart disease Maternal Grandfather   . Alcohol abuse Paternal Grandmother   . Alcohol abuse Paternal Grandfather   . Melanoma      uncle     Social History   Social History  . Marital Status: Single    Spouse Name: N/A  . Number of Children: N/A  . Years of Education: N/A   Social History Main Topics  . Smoking status: Never Smoker   . Smokeless tobacco: Never Used  . Alcohol Use: 0.0 oz/week    0 Standard drinks or equivalent per week     Comment: 2-3 a month  . Drug Use: No  . Sexual Activity: Not on file   Other Topics Concern  . Not on file   Social History Narrative   Single college educated    [employer]  About 44 - 7 per week.    h h of 1  No pets currently   Exercising  Regularly recently at least 4 x per week     6-7 hours sleep    No falls .  Has smoke detector and wears seat belts.  No firearms. No excess sun exposure. . No depression. Partial dentures upper   caffine in am  etoh 2-3 month   Vegetarian diet recently                 Outpatient Prescriptions Prior to Visit  Medication Sig Dispense Refill  . ALPRAZolam (XANAX) 0.5 MG tablet Take 0.5 mg by mouth 4 (four) times daily as needed.     . fish oil-omega-3 fatty acids 1000 MG capsule Take 2 g by mouth daily.    Marland Kitchen levothyroxine (SYNTHROID, LEVOTHROID) 50 MCG tablet TAKE 1 TABLET BY MOUTH ONCE DAILY 30 tablet 4  . levothyroxine (SYNTHROID, LEVOTHROID)  50 MCG tablet TAKE 1 TABLET BY MOUTH EVERY DAY IN THE MORNING BEFORE BREAKFAST 30 tablet 11  . Lutein 40 MG CAPS Take 40 mg by mouth daily.    . MULTIPLE VITAMIN PO Take by mouth.    . norethindrone-ethinyl estradiol (MICROGESTIN,JUNEL,LOESTRIN) 1-20 MG-MCG tablet Take 1 tablet by mouth daily. 1 Package 2   No facility-administered medications prior to visit.     EXAM:  There were no vitals taken for this visit.  There is no weight on file to calculate BMI.  GENERAL: vitals reviewed and listed above, alert, oriented, appears well hydrated and in no acute distress HEENT: atraumatic, conjunctiva  clear, no obvious abnormalities on inspection of external nose and ears OP : no lesion edema or exudate  NECK: no obvious masses on inspection palpation  LUNGS: clear to auscultation bilaterally, no wheezes, rales or rhonchi, good air movement CV: HRRR, no clubbing cyanosis or  peripheral edema nl cap refill  MS: moves all extremities without noticeable focal  abnormality PSYCH: pleasant and cooperative, no obvious depression  or anxiety Lab Results  Component Value Date   WBC 9.9 06/29/2015   HGB 14.9 06/29/2015   HCT 44.9 06/29/2015   PLT 346.0 06/29/2015   GLUCOSE 97 06/29/2015   CHOL 212* 06/29/2015   TRIG 88.0 06/29/2015   HDL 58.90 06/29/2015   LDLDIRECT 154.4 06/17/2013   LDLCALC 136* 06/29/2015   ALT 11 06/29/2015   AST 16 06/29/2015   NA 139 06/29/2015   K 4.6 06/29/2015   CL 103 06/29/2015   CREATININE 0.82 06/29/2015   BUN 12 06/29/2015   CO2 26 06/29/2015   TSH 4.30 06/29/2015    ASSESSMENT AND PLAN:  Discussed the following assessment and plan:  No diagnosis found.  -Patient advised to return or notify health care team  if symptoms worsen ,persist or new concerns arise.  There are no Patient Instructions on file for this visit.   Standley Brooking. Chameka Mcmullen M.D.

## 2015-10-25 ENCOUNTER — Other Ambulatory Visit: Payer: Self-pay | Admitting: Family Medicine

## 2015-10-25 DIAGNOSIS — E038 Other specified hypothyroidism: Secondary | ICD-10-CM

## 2015-10-25 DIAGNOSIS — Z780 Asymptomatic menopausal state: Secondary | ICD-10-CM

## 2015-11-09 ENCOUNTER — Other Ambulatory Visit: Payer: Self-pay

## 2015-11-16 ENCOUNTER — Ambulatory Visit: Payer: No Typology Code available for payment source | Admitting: Obstetrics and Gynecology

## 2017-09-18 ENCOUNTER — Encounter: Payer: Self-pay | Admitting: Internal Medicine
